# Patient Record
Sex: Female | Born: 1955 | Race: White | Hispanic: Yes | Marital: Single | Smoking: Never smoker
Health system: Southern US, Community
[De-identification: ages and names within clinical notes are randomized; demographics above are authoritative.]

## PROBLEM LIST (undated history)

## (undated) HISTORY — PX: TUBAL LIGATION: SHX77

## (undated) HISTORY — PX: EXTERNAL EAR SURGERY: SHX627

---

## 2003-10-16 ENCOUNTER — Ambulatory Visit (HOSPITAL_COMMUNITY): Admission: RE | Admit: 2003-10-16 | Discharge: 2003-10-16 | Payer: Self-pay | Admitting: Internal Medicine

## 2003-10-20 ENCOUNTER — Ambulatory Visit (HOSPITAL_COMMUNITY): Admission: RE | Admit: 2003-10-20 | Discharge: 2003-10-20 | Payer: Self-pay | Admitting: Internal Medicine

## 2004-01-25 ENCOUNTER — Encounter: Admission: RE | Admit: 2004-01-25 | Discharge: 2004-01-25 | Payer: Self-pay | Admitting: Otolaryngology

## 2006-03-10 ENCOUNTER — Emergency Department (HOSPITAL_COMMUNITY): Admission: EM | Admit: 2006-03-10 | Discharge: 2006-03-10 | Payer: Self-pay | Admitting: Emergency Medicine

## 2014-05-05 ENCOUNTER — Ambulatory Visit
Admission: RE | Admit: 2014-05-05 | Discharge: 2014-05-05 | Disposition: A | Discharge status: Home / Self Care | Payer: No Typology Code available for payment source | Source: Ambulatory Visit | Attending: Infectious Disease | Admitting: Infectious Disease

## 2014-05-05 ENCOUNTER — Other Ambulatory Visit: Payer: Self-pay | Admitting: Infectious Disease

## 2014-05-05 DIAGNOSIS — R7611 Nonspecific reaction to tuberculin skin test without active tuberculosis: Secondary | ICD-10-CM

## 2014-11-11 ENCOUNTER — Ambulatory Visit: Payer: Self-pay | Admitting: Gynecology

## 2014-12-10 ENCOUNTER — Ambulatory Visit (INDEPENDENT_AMBULATORY_CARE_PROVIDER_SITE_OTHER): Payer: BC Managed Care – PPO | Admitting: Gynecology

## 2014-12-10 ENCOUNTER — Encounter: Payer: Self-pay | Admitting: Gynecology

## 2014-12-10 ENCOUNTER — Other Ambulatory Visit (HOSPITAL_COMMUNITY)
Admission: RE | Admit: 2014-12-10 | Discharge: 2014-12-10 | Disposition: A | Discharge status: Home / Self Care | Payer: BC Managed Care – PPO | Source: Ambulatory Visit | Attending: Gynecology | Admitting: Gynecology

## 2014-12-10 VITALS — BP 128/86 | Ht 59.75 in | Wt 135.0 lb

## 2014-12-10 DIAGNOSIS — Z1151 Encounter for screening for human papillomavirus (HPV): Secondary | ICD-10-CM | POA: Insufficient documentation

## 2014-12-10 DIAGNOSIS — R52 Pain, unspecified: Secondary | ICD-10-CM

## 2014-12-10 DIAGNOSIS — Z1159 Encounter for screening for other viral diseases: Secondary | ICD-10-CM | POA: Diagnosis not present

## 2014-12-10 DIAGNOSIS — Z780 Asymptomatic menopausal state: Secondary | ICD-10-CM | POA: Diagnosis not present

## 2014-12-10 DIAGNOSIS — Z01419 Encounter for gynecological examination (general) (routine) without abnormal findings: Secondary | ICD-10-CM

## 2014-12-10 NOTE — Progress Notes (Signed)
Katelyn Phillips [birth year] [id number]   History:    59 y.o.  for annual gyn exam who has not had a medical examination and close to 10 years. Patient is from [place]. She reports no past history of any abnormal Pap smears. She has not had a colonoscopy. Her last mammogram was in 2005. She states she received her flu vaccine at work. She's had 3 children all delivered vaginally. She stated she recently menopause in her 71s has never been on hormone replacement therapy. Has no vasomotor symptoms. Only complaint is been of tiredness and fatigue. She works as a Secretary/administrator.  Past medical history,surgical history, family history and social history were all reviewed and documented in the EPIC chart.  Gynecologic History No LMP recorded. Patient is postmenopausal. Contraception: post menopausal status Last Pap: Over 10 years ago. Results were: normal Last mammogram: 2005. Results were: normal  Obstetric History OB History  Gravida Para Term Preterm AB SAB TAB Ectopic Multiple Living  3 3        3     # Outcome Date GA Lbr Len/2nd Weight Sex Delivery Anes PTL Lv  3 Para      Vag-Spont     2 Para      Vag-Spont     1 Para      Vag-Spont          ROS: A ROS was performed and pertinent positives and negatives are included in the history.  GENERAL: No fevers or chills. HEENT: No change in vision, no earache, sore throat or sinus congestion. NECK: No pain or stiffness. CARDIOVASCULAR: No chest pain or pressure. No palpitations. PULMONARY: No shortness of breath, cough or wheeze. GASTROINTESTINAL: No abdominal pain, nausea, vomiting or diarrhea, melena or bright red blood per rectum. GENITOURINARY: No urinary frequency, urgency, hesitancy or dysuria. MUSCULOSKELETAL: No joint or muscle pain, no back pain, no recent trauma. DERMATOLOGIC: No rash, no itching, no lesions. ENDOCRINE: No polyuria, polydipsia, no heat or cold intolerance. No recent change in weight. HEMATOLOGICAL: No  anemia or easy bruising or bleeding. NEUROLOGIC: No headache, seizures, numbness, tingling or weakness. PSYCHIATRIC: No depression, no loss of interest in normal activity or change in sleep pattern.     Exam: chaperone present  BP 128/86 mmHg  Ht 4' 11.75" (1.518 m)  Wt 135 lb (61.236 kg)  BMI 26.57 kg/m2  Body mass index is 26.57 kg/(m^2).  General appearance : Well developed well nourished female. No acute distress HEENT: Eyes: no retinal hemorrhage or exudates,  Neck supple, trachea midline, no carotid bruits, no thyroidmegaly Lungs: Clear to auscultation, no rhonchi or wheezes, or rib retractions  Heart: Regular rate and rhythm, no murmurs or gallops Breast:Examined in sitting and supine position were symmetrical in appearance, no palpable masses or tenderness,  no skin retraction, no nipple inversion, no nipple discharge, no skin discoloration, no axillary or supraclavicular lymphadenopathy Abdomen: no palpable masses or tenderness, no rebound or guarding Extremities: no edema or skin discoloration or tenderness  Pelvic:  Bartholin, Urethra, Skene Glands: Within normal limits             Vagina: No gross lesions or discharge, atrophic changes friable contact  Cervix: No gross lesions or discharge  Uterus  anteverted, normal size, shape and consistency, non-tender and mobile  Adnexa  Without masses or tenderness  Anus and perineum  normal   Rectovaginal  normal sphincter tone without palpated masses or tenderness  Hemoccult will schedule colonoscopy     Assessment/Plan:  59 y.o. female for annual exam with no prior medical exam in over 10 years. Pap smear with HPV screening will be done today. Patient will return back to the office in a fasting state later in the week for the following screening blood work: Comprehensive metabolic panel, fasting lipid profile, TSH, CBC, and urinalysis. I have spoken to patient's daughter and gave her the name of the gas urologist for  her to schedule a screening colonoscopy. I've also provided her with a requisition to schedule mammogram. Next year we'll need to do a bone density study. Of note because of tiredness and fatigue we went ahead and checked her also for vitamin D level.  New CDC guidelines is recommending patients be tested once in her lifetime for hepatitis C antibody who were born between [birth year] through 1965. This was discussed with the patient today and has agreed to be tested today.   Terrance Mass MD, 10:16 AM 12/10/2014

## 2014-12-10 NOTE — Patient Instructions (Signed)
Colonoscopa (Colonoscopy) Ardelia Mems colonoscopa es un examen que se realiza para examinar todo el intestino grueso (colon). Este examen puede ayudar a Hydrographic surveyor problemas, como tumores, plipos, inflamacin y reas de Social research officer, government. El examen dura aproximadamente 1hora.  INFORME A SU MDICO:   Cualquier alergia que tenga.  Todos los [employer], incluidos vitaminas, hierbas, gotas oftlmicas, cremas y medicamentos de venta libre.  Problemas previos que usted o los [employer] de su familia hayan tenido con el uso de anestsicos.  Enfermedades de [employer].  Cirugas previas.  Enfermedades patolgicas. RIESGOS Y COMPLICACIONES  En general, se trata de un procedimiento seguro. Sin embargo, Games developer procedimiento, pueden surgir complicaciones. Las complicaciones posibles son:  Hemorragias.  Desgarro o ruptura de la pared del colon.  Reaccin a los Stage manager.  Infeccin (raro). ANTES DEL PROCEDIMIENTO   Consulte a su mdico si debe cambiar o suspender los medicamentos que toma habitualmente.  Posiblemente se le recete una preparacin del colon por va oral. Esto incluye beber una gran cantidad de lquido medicinal desde el da anterior a su procedimiento. El lquido har que elimine muchas heces blandas hasta que sean casi claras o de color verdoso claro. De esta manera limpiar el colon para prepararlo para el procedimiento.  No coma ni beba nada ms una vez que haya comenzado con la preparacin del colon, a menos que el mdico le indique que es seguro [place].  Pdale a alguna persona que la lleve a su casa luego del procedimiento. PROCEDIMIENTO   Le administrarn un medicamento para que pueda relajarse (sedante).  Se recostar de costado con las rodillas flexionadas.  Se insertar un tubo largo y flexible con Ardelia Mems luz y Ardelia Mems cmara en el extremo (colonoscopio) a travs del recto y dentro del colon. La cmara enviar el video hacia  una pantalla de computadora a medida que se vaya moviendo por el colon. El colonoscopio tambin libera dixido de carbono para inflar el colon. Esto ayuda a que el mdico pueda ver mejor el rea.  Durante el examen, es posible que su mdico tome una pequea muestra de tejido (biopsia) para examinarla bajo el microscopio, si se encuentran anormalidades.  El examen finaliza cuando se ha examinado todo el colon. DESPUS DEL PROCEDIMIENTO   No conduzca vehculos durante las 24horas posteriores al examen.  Es posible que encuentre una pequea cantidad de sangre en la materia fecal.  Ileene Hutchinson tenga cantidades moderadas de gases y calambres o hinchazn abdominales leves. Esto se produce a causa del gas utilizado para inflar el colon durante el examen.  Pregunte cundo estarn [employer] del examen y cmo los obtendr. Asegrese de [place].   Esta informacin no tiene Marine scientist el consejo del mdico. Asegrese de hacerle al mdico cualquier pregunta que tenga.   Document Released: 11/09/2004 Document Revised: 11/20/2012 Elsevier Interactive Patient Education [employer].

## 2014-12-14 LAB — CYTOLOGY - PAP

## 2014-12-16 ENCOUNTER — Other Ambulatory Visit: Payer: BC Managed Care – PPO

## 2014-12-16 DIAGNOSIS — Z780 Asymptomatic menopausal state: Secondary | ICD-10-CM

## 2014-12-16 DIAGNOSIS — Z1159 Encounter for screening for other viral diseases: Secondary | ICD-10-CM

## 2014-12-16 DIAGNOSIS — R52 Pain, unspecified: Secondary | ICD-10-CM

## 2014-12-16 DIAGNOSIS — Z01419 Encounter for gynecological examination (general) (routine) without abnormal findings: Secondary | ICD-10-CM

## 2014-12-16 LAB — COMPREHENSIVE METABOLIC PANEL
ALBUMIN: 4.3 g/dL (ref 3.6–5.1)
ALT: 13 U/L (ref 6–29)
AST: 20 U/L (ref 10–35)
Alkaline Phosphatase: 80 U/L (ref 33–130)
BUN: 16 mg/dL (ref 7–25)
CO2: 27 mmol/L (ref 20–31)
Calcium: 9.9 mg/dL (ref 8.6–10.4)
Chloride: 104 mmol/L (ref 98–110)
Creat: 0.67 mg/dL (ref 0.50–1.05)
Glucose, Bld: 85 mg/dL (ref 65–99)
Potassium: 4 mmol/L (ref 3.5–5.3)
Sodium: 139 mmol/L (ref 135–146)
Total Bilirubin: 0.6 mg/dL (ref 0.2–1.2)
Total Protein: 7.8 g/dL (ref 6.1–8.1)

## 2014-12-16 LAB — LIPID PANEL
Cholesterol: 208 mg/dL — ABNORMAL HIGH (ref 125–200)
HDL: 93 mg/dL (ref >=46)
LDL Cholesterol: 101 mg/dL (ref <130)
TRIGLYCERIDES: 69 mg/dL (ref <150)
Total CHOL/HDL Ratio: 2.2 Ratio (ref <=5.0)
VLDL: 14 mg/dL (ref <30)

## 2014-12-16 LAB — TSH: TSH: 1.57 u[IU]/mL (ref 0.350–4.500)

## 2014-12-17 LAB — URINALYSIS W MICROSCOPIC + REFLEX CULTURE
Bacteria, UA: NONE SEEN [HPF]
Bilirubin Urine: NEGATIVE
Casts: NONE SEEN [LPF]
Crystals: NONE SEEN [HPF]
GLUCOSE, UA: NEGATIVE
Hgb urine dipstick: NEGATIVE
Ketones, ur: NEGATIVE
LEUKOCYTES UA: NEGATIVE
Nitrite: NEGATIVE
PH: 5.5 (ref 5.0–8.0)
Protein, ur: NEGATIVE
RBC / HPF: NONE SEEN RBC/HPF (ref <=2)
SPECIFIC GRAVITY, URINE: 1.016 (ref 1.001–1.035)
Squamous Epithelial / HPF: NONE SEEN [HPF] (ref <=5)
WBC, UA: NONE SEEN WBC/HPF (ref <=5)
Yeast: NONE SEEN [HPF]

## 2014-12-17 LAB — VITAMIN D 25 HYDROXY (VIT D DEFICIENCY, FRACTURES): Vit D, 25-Hydroxy: 52 ng/mL (ref 30–100)

## 2014-12-17 LAB — HEPATITIS C ANTIBODY: HCV Ab: NEGATIVE

## 2015-09-03 ENCOUNTER — Encounter: Payer: Self-pay | Admitting: Gastroenterology

## 2015-09-03 ENCOUNTER — Other Ambulatory Visit: Payer: Self-pay | Admitting: Gynecology

## 2015-09-03 DIAGNOSIS — Z1231 Encounter for screening mammogram for malignant neoplasm of breast: Secondary | ICD-10-CM

## 2015-09-24 ENCOUNTER — Ambulatory Visit
Admission: RE | Admit: 2015-09-24 | Discharge: 2015-09-24 | Disposition: A | Discharge status: Home / Self Care | Payer: BC Managed Care – PPO | Source: Ambulatory Visit | Attending: Gynecology | Admitting: Gynecology

## 2015-09-24 DIAGNOSIS — Z1231 Encounter for screening mammogram for malignant neoplasm of breast: Secondary | ICD-10-CM

## 2015-09-28 ENCOUNTER — Other Ambulatory Visit: Payer: Self-pay | Admitting: Gynecology

## 2015-09-28 DIAGNOSIS — R928 Other abnormal and inconclusive findings on diagnostic imaging of breast: Secondary | ICD-10-CM

## 2015-10-01 ENCOUNTER — Ambulatory Visit
Admission: RE | Admit: 2015-10-01 | Discharge: 2015-10-01 | Disposition: A | Discharge status: Home / Self Care | Payer: BC Managed Care – PPO | Source: Ambulatory Visit | Attending: Gynecology | Admitting: Gynecology

## 2015-10-01 DIAGNOSIS — R928 Other abnormal and inconclusive findings on diagnostic imaging of breast: Secondary | ICD-10-CM

## 2015-10-13 ENCOUNTER — Telehealth: Payer: Self-pay | Admitting: Behavioral Health

## 2015-10-13 NOTE — Telephone Encounter (Signed)
Unable to reach patient at time of Pre-Visit Call.  Left message for patient to return call when available.    

## 2015-10-14 ENCOUNTER — Ambulatory Visit (HOSPITAL_BASED_OUTPATIENT_CLINIC_OR_DEPARTMENT_OTHER)
Admission: RE | Admit: 2015-10-14 | Discharge: 2015-10-14 | Disposition: A | Discharge status: Home / Self Care | Payer: BC Managed Care – PPO | Source: Ambulatory Visit | Attending: Family Medicine | Admitting: Family Medicine

## 2015-10-14 ENCOUNTER — Ambulatory Visit (INDEPENDENT_AMBULATORY_CARE_PROVIDER_SITE_OTHER): Payer: BC Managed Care – PPO | Admitting: Family Medicine

## 2015-10-14 ENCOUNTER — Encounter: Payer: Self-pay | Admitting: Family Medicine

## 2015-10-14 VITALS — BP 108/70 | HR 58 | Temp 98.6°F | Ht 60.0 in | Wt 139.8 lb

## 2015-10-14 DIAGNOSIS — Z131 Encounter for screening for diabetes mellitus: Secondary | ICD-10-CM | POA: Diagnosis not present

## 2015-10-14 DIAGNOSIS — M79642 Pain in left hand: Secondary | ICD-10-CM | POA: Diagnosis not present

## 2015-10-14 DIAGNOSIS — M19042 Primary osteoarthritis, left hand: Secondary | ICD-10-CM | POA: Insufficient documentation

## 2015-10-14 DIAGNOSIS — Z1329 Encounter for screening for other suspected endocrine disorder: Secondary | ICD-10-CM

## 2015-10-14 DIAGNOSIS — Z1211 Encounter for screening for malignant neoplasm of colon: Secondary | ICD-10-CM

## 2015-10-14 DIAGNOSIS — Z130 Encounter for screening for diseases of the blood and blood-forming organs and certain disorders involving the immune mechanism: Secondary | ICD-10-CM | POA: Diagnosis not present

## 2015-10-14 DIAGNOSIS — M19041 Primary osteoarthritis, right hand: Secondary | ICD-10-CM | POA: Insufficient documentation

## 2015-10-14 DIAGNOSIS — M79641 Pain in right hand: Secondary | ICD-10-CM | POA: Diagnosis present

## 2015-10-14 DIAGNOSIS — Z13220 Encounter for screening for lipoid disorders: Secondary | ICD-10-CM

## 2015-10-14 DIAGNOSIS — R208 Other disturbances of skin sensation: Secondary | ICD-10-CM

## 2015-10-14 DIAGNOSIS — R200 Anesthesia of skin: Secondary | ICD-10-CM

## 2015-10-14 DIAGNOSIS — Z23 Encounter for immunization: Secondary | ICD-10-CM

## 2015-10-14 DIAGNOSIS — Z119 Encounter for screening for infectious and parasitic diseases, unspecified: Secondary | ICD-10-CM

## 2015-10-14 DIAGNOSIS — R7303 Prediabetes: Secondary | ICD-10-CM | POA: Insufficient documentation

## 2015-10-14 LAB — COMPREHENSIVE METABOLIC PANEL
ALBUMIN: 4.4 g/dL (ref 3.5–5.2)
ALT: 14 U/L (ref 0–35)
AST: 20 U/L (ref 0–37)
Alkaline Phosphatase: 76 U/L (ref 39–117)
BUN: 13 mg/dL (ref 6–23)
CO2: 30 mEq/L (ref 19–32)
Calcium: 9.6 mg/dL (ref 8.4–10.5)
Chloride: 104 mEq/L (ref 96–112)
Creatinine, Ser: 0.65 mg/dL (ref 0.40–1.20)
GFR: 98.61 mL/min (ref >60.00)
Glucose, Bld: 90 mg/dL (ref 70–99)
Potassium: 3.9 mEq/L (ref 3.5–5.1)
Sodium: 138 mEq/L (ref 135–145)
Total Bilirubin: 0.6 mg/dL (ref 0.2–1.2)
Total Protein: 8.2 g/dL (ref 6.0–8.3)

## 2015-10-14 LAB — LIPID PANEL
CHOLESTEROL: 210 mg/dL — AB (ref 0–200)
HDL: 85.7 mg/dL (ref >39.00)
LDL Cholesterol: 114 mg/dL — ABNORMAL HIGH (ref 0–99)
NonHDL: 124.36
Total CHOL/HDL Ratio: 2
Triglycerides: 53 mg/dL (ref 0.0–149.0)
VLDL: 10.6 mg/dL (ref 0.0–40.0)

## 2015-10-14 LAB — TSH: TSH: 0.96 u[IU]/mL (ref 0.35–4.50)

## 2015-10-14 LAB — CBC
HCT: 40.7 % (ref 36.0–46.0)
Hemoglobin: 13.6 g/dL (ref 12.0–15.0)
MCHC: 33.5 g/dL (ref 30.0–36.0)
MCV: 90.7 fl (ref 78.0–100.0)
Platelets: 271 10*3/uL (ref 150.0–400.0)
RBC: 4.49 Mil/uL (ref 3.87–5.11)
RDW: 14 % (ref 11.5–15.5)
WBC: 4.6 10*3/uL (ref 4.0–10.5)

## 2015-10-14 LAB — HEPATITIS C ANTIBODY: HCV AB: NEGATIVE

## 2015-10-14 LAB — VITAMIN B12: Vitamin B-12: 450 pg/mL (ref 211–911)

## 2015-10-14 LAB — HEMOGLOBIN A1C: Hgb A1c MFr Bld: 6.2 % (ref 4.6–6.5)

## 2015-10-14 LAB — FOLATE: Folate: >23.7 ng/mL (ref >5.9)

## 2015-10-14 NOTE — Progress Notes (Signed)
Pre visit review using our clinic review tool, if applicable. No additional management support is needed unless otherwise documented below in the visit note. 

## 2015-10-14 NOTE — Patient Instructions (Addendum)
It was very nice to see you today- I will be in touch with your labs asap After your blood draw please go to the ground floor to have your x-rays done. Then you can go home We will look for any cause of your hand pain in your blood and also on x-rays.  You can certainly try using OTC tylenol, and if that is not helpful than aleve or ibuprofen for pain in your hands. 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

## 2015-10-14 NOTE — Progress Notes (Signed)
[facility] [address] [place], [place] [address] [id number] [phone]  Date:  10/14/2015   Name:  Katelyn Phillips   DOB:  03-20-1955   MRN:  [id number]  PCP:  Lamar Blinks, MD    Chief Complaint: Establish Care (Pt here to est care. c/o occ aching in hands and numbness. will also have occ weakness when trying to lift things.  Occ joint pain that is starting to happen more often.  Would like flu vaccine today. )   History of Present Illness:  Katelyn Phillips is a 60 y.o. very pleasant female patient who presents with the following:  Here today as a new patient to establish care.  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 Otherwise she is generally in good heatlh.   She has noted numbness in both of her hands.  The right is worse.  Seems to get worse in the evenings/ nights, better in the morning. She notes a glove distribution of her numbness in both hands about up to the elbow.  A few years ago this was just in her fingers, but is now more into her arms.   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 She has not really tried any medications for her hand or leg pains, including anything OTC She is fasting today for labs  Would like a flu shot today  She has a mammogram scheduled for 2 weeks from now.  She sees Dr. Toney Rakes for her OBG care She would like a referral for a colonoscopy today  There are no active problems to display for this patient.   History reviewed. No pertinent past medical history.  Past Surgical History:  Procedure Laterality Date  . EXTERNAL EAR SURGERY    . TUBAL LIGATION      Social History  Substance Use Topics  . Smoking status: Never Smoker  . Smokeless tobacco: Not on file  . Alcohol use No    Family History  Problem Relation Age of  Onset  . Diabetes Sister     No Known Allergies  Medication list has been reviewed and updated.  No current outpatient prescriptions on file prior to visit.   No current facility-administered medications on file prior to visit.     Review of Systems: No fever, chills, nausea, vomiting, weight loss  As per HPI- otherwise negative.   Physical Examination: Vitals:   10/14/15 0957  BP: 108/70  Pulse: (!) 58  Temp: 98.6 F (37 C)   Vitals:   10/14/15 0957  Weight: 139 lb 12.8 oz (63.4 kg)  Height: 5' (1.524 m)   Body mass index is 27.3 kg/m. Ideal Body Weight: Weight in (lb) to have BMI = 25: 127.7  GEN: WDWN, NAD, Non-toxic, A & O x 3, mild overweight, looks well HEENT: Atraumatic, Normocephalic. Neck supple. No masses, No LAD.  Bilateral TM wnl, oropharynx normal.  PEERL,EOMI.   Ears and Nose: No external deformity. CV: RRR, No M/G/R. No JVD. No thrill. No extra heart sounds. PULM: CTA B, no wheezes, crackles, rhonchi. No retractions. No resp. distress. No accessory muscle use. ABD: S, NT, ND, +BS. No rebound. No HSM. EXTR: No c/c/e Spider veins on bilateral calves but do not see true varicose  veins Hands/ forearms, elbows show normal ROM. Normal strength and perfusion.  Slight hypertrophy of the IP joints of the fingers likely due to OA Negative testing for CTS including Tinel's and Phalen's today NEURO Normal gait.  PSYCH: Normally interactive. Conversant. Not depressed or anxious appearing.  Calm demeanor.    Assessment and Plan: Numbness in both hands - Plan: B12, Folate  Screening for deficiency anemia - Plan: CBC  Screening for thyroid disorder - Plan: TSH  Screening for lipid disorders - Plan: Lipid panel  Encounter for screening for infectious and parasitic diseases, unspecified - Plan: Hepatitis C antibody  Screening for diabetes mellitus - Plan: Comprehensive metabolic panel, Hemoglobin A1c  Pain in both hands - Plan: DG Hand Complete Right, DG  Hand Complete Left  It was very nice to see you today- I will be in touch with your labs asap After your blood draw please go to the ground floor to have your x-rays done. Then you can go home We will look for any cause of your hand pain in your blood and also on x-rays.  You can certainly try using OTC tylenol, and if that is not helpful than aleve or ibuprofen for pain in your hands. 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    Signed Lamar Blinks, MD  Results for orders placed or performed in visit on 10/14/15  CBC  Result Value Ref Range   WBC 4.6 4.0 - 10.5 K/uL   RBC 4.49 3.87 - 5.11 Mil/uL   Platelets 271.0 150.0 - 400.0 K/uL   Hemoglobin 13.6 12.0 - 15.0 g/dL   HCT 40.7 36.0 - 46.0 %   MCV 90.7 78.0 - 100.0 fl   MCHC 33.5 30.0 - 36.0 g/dL   RDW 14.0 11.5 - 15.5 %  Comprehensive metabolic panel  Result Value Ref Range   Sodium 138 135 - 145 mEq/L   Potassium 3.9 3.5 - 5.1 mEq/L   Chloride 104 96 - 112 mEq/L   CO2 30 19 - 32 mEq/L   Glucose, Bld 90 70 - 99 mg/dL   BUN 13 6 - 23 mg/dL   Creatinine, Ser 0.65 0.40 - 1.20 mg/dL   Total Bilirubin 0.6 0.2 - 1.2 mg/dL   Alkaline Phosphatase 76 39 - 117 U/L   AST 20 0 - 37 U/L   ALT 14 0 - 35 U/L   Total Protein 8.2 6.0 - 8.3 g/dL   Albumin 4.4 3.5 - 5.2 g/dL   Calcium 9.6 8.4 - 10.5 mg/dL   GFR 98.61 >60.00 mL/min  Lipid panel  Result Value Ref Range   Cholesterol 210 (H) 0 - 200 mg/dL   Triglycerides 53.0 0.0 - 149.0 mg/dL   HDL 85.70 >39.00 mg/dL   VLDL 10.6 0.0 - 40.0 mg/dL   LDL Cholesterol 114 (H) 0 - 99 mg/dL   Total CHOL/HDL Ratio 2    NonHDL 124.36   TSH  Result Value Ref Range   TSH 0.96 0.35 - 4.50 uIU/mL  Hepatitis C antibody  Result Value Ref Range   HCV Ab NEGATIVE NEGATIVE  Hemoglobin A1c  Result Value Ref Range    Hgb A1c MFr Bld 6.2 4.6 - 6.5 %  B12  Result Value Ref Range   Vitamin B-12 450 211 -  911 pg/mL  Folate  Result Value Ref Range   Folate >23.7 >5.9 ng/mL   Added pre-diabetes to problem list

## 2015-11-01 ENCOUNTER — Ambulatory Visit (AMBULATORY_SURGERY_CENTER): Payer: Self-pay

## 2015-11-01 ENCOUNTER — Encounter: Payer: Self-pay | Admitting: Gastroenterology

## 2015-11-01 VITALS — Ht 60.0 in | Wt 144.0 lb

## 2015-11-01 DIAGNOSIS — Z1211 Encounter for screening for malignant neoplasm of colon: Secondary | ICD-10-CM

## 2015-11-01 MED ORDER — SUPREP BOWEL PREP KIT 17.5-3.13-1.6 GM/177ML PO SOLN: 1.0000 | Freq: Once | ORAL | 0 refills | Status: AC

## 2015-11-01 NOTE — Progress Notes (Signed)
No allergies to eggs or soy No past problems with anesthesia No diet meds No home oxygen  No internet 

## 2015-11-15 ENCOUNTER — Encounter: Payer: Self-pay | Admitting: Gastroenterology

## 2015-11-15 ENCOUNTER — Telehealth: Payer: Self-pay | Admitting: Gastroenterology

## 2015-11-15 ENCOUNTER — Ambulatory Visit (AMBULATORY_SURGERY_CENTER): Payer: BC Managed Care – PPO | Admitting: Gastroenterology

## 2015-11-15 VITALS — BP 100/57 | HR 62 | Temp 97.5°F | Resp 18

## 2015-11-15 DIAGNOSIS — D127 Benign neoplasm of rectosigmoid junction: Secondary | ICD-10-CM

## 2015-11-15 DIAGNOSIS — Z1211 Encounter for screening for malignant neoplasm of colon: Secondary | ICD-10-CM

## 2015-11-15 MED ORDER — SODIUM CHLORIDE 0.9 % IV SOLN: 500.0000 mL | INTRAVENOUS | Status: DC

## 2015-11-15 NOTE — Op Note (Signed)
[place] Patient Name: Katelyn Phillips Procedure Date: 11/15/2015 10:42 AM MRN: [id number] Endoscopist: Remo Lipps P. Havery Moros , MD Age: 60 Referring MD:  Date of Birth: 05-13-1955 Gender: Female Account #: [id number] Procedure:                Colonoscopy Indications:              Screening for malignant neoplasm in the colon, This                            is the patient's first colonoscopy Medicines:                Monitored Anesthesia Care Procedure:                Pre-Anesthesia Assessment:                           - Prior to the procedure, a History and Physical                            was performed, and patient medications and                            allergies were reviewed. The patient's tolerance of                            previous anesthesia was also reviewed. The risks                            and benefits of the procedure and the sedation                            options and risks were discussed with the patient.                            All questions were answered, and informed consent                            was obtained. Prior Anticoagulants: The patient has                            taken no previous anticoagulant or antiplatelet                            agents. ASA Grade Assessment: I - A normal, healthy                            patient. After reviewing the risks and benefits,                            the patient was deemed in satisfactory condition to                            undergo the procedure.  After obtaining informed consent, the colonoscope                            was passed under direct vision. Throughout the                            procedure, the patient's blood pressure, pulse, and                            oxygen saturations were monitored continuously. The                            Model CF-HQ190L [id number]) scope was introduced                            through the anus and  advanced to the the cecum,                            identified by appendiceal orifice and ileocecal                            valve. The colonoscopy was performed without                            difficulty. The patient tolerated the procedure                            well. The quality of the bowel preparation was                            good. The ileocecal valve, appendiceal orifice, and                            rectum were photographed. Scope In: 10:58:47 AM Scope Out: 11:13:27 AM Scope Withdrawal Time: 0 hours 11 minutes 21 seconds  Total Procedure Duration: 0 hours 14 minutes 40 seconds  Findings:                 The perianal and digital rectal examinations were                            normal.                           A 4 mm polyp was found in the recto-sigmoid colon.                            The polyp was sessile. The polyp was removed with a                            cold snare. Resection and retrieval were complete.                           The exam was otherwise without abnormality on  direct and retroflexion views. Complications:            No immediate complications. Estimated blood loss:                            Minimal. Estimated Blood Loss:     Estimated blood loss was minimal. Impression:               - One 4 mm polyp at the recto-sigmoid colon,                            removed with a cold snare. Resected and retrieved.                           - The examination was otherwise normal on direct                            and retroflexion views. Recommendation:           - Patient has a contact number available for                            emergencies. The signs and symptoms of potential                            delayed complications were discussed with the                            patient. Return to normal activities tomorrow.                            Written discharge instructions were provided to the                             patient.                           - Resume previous diet.                           - Continue present medications.                           - No ibuprofen, naproxen, or other non-steroidal                            anti-inflammatory drugs for 2 weeks after polyp                            removal.                           - Await pathology results.                           - Repeat colonoscopy is recommended for  surveillance. The colonoscopy date will be                            determined after pathology results from today's                            exam become available for review. Remo Lipps P. Armbruster, MD 11/15/2015 11:16:09 AM This report has been signed electronically.

## 2015-11-15 NOTE — Progress Notes (Signed)
Called to room to assist during endoscopic procedure.  Patient ID and intended procedure confirmed with present staff. Received instructions for my participation in the procedure from the performing physician.  

## 2015-11-15 NOTE — Telephone Encounter (Signed)
Female answered phone when returning call.  He stated that she has GERD and often takes Adult nurse.  Advised MD will not be able to add-on EGD today due to time constraints but she may discuss further with him at procedure visit this am.                                                                     Angela/PV

## 2015-11-15 NOTE — Progress Notes (Signed)
interpreter and family member that speaks english at patient's bedside during recovery. During discharge explained for patient not to take non-steriodal anti-inflammaotry medications for 2 weeks including Aleve, Ibuprofen. Tylenol okay to use. They verbalizes understanding.

## 2015-11-15 NOTE — Progress Notes (Signed)
A/ox3, pleased with MAC, report to RN 

## 2015-11-15 NOTE — Patient Instructions (Addendum)
Colon polyp x 1 removed today. Result letter in your mail in 2-3 weeks.  Resume current medications. DO NOT take ibuprofen, naproxen, or other anti-inflammatory medications for 2 weeks.  Start Omeprazole 20 mg over the counter daily. Keep appointment to see Dr.Armbruster for reflux.   YOU HAD AN ENDOSCOPIC PROCEDURE TODAY AT [facility]:   Refer to the procedure report that was given to you for any specific questions about what was found during the examination.  If the procedure report does not answer your questions, please call your gastroenterologist to clarify.  If you requested that your care partner not be given the details of your procedure findings, then the procedure report has been included in a sealed envelope for you to review at your convenience later.  YOU SHOULD EXPECT: Some feelings of bloating in the abdomen. Passage of more gas than usual.  Walking can help get rid of the air that was put into your GI tract during the procedure and reduce the bloating. If you had a lower endoscopy (such as a colonoscopy or flexible sigmoidoscopy) you may notice spotting of blood in your stool or on the toilet paper. If you underwent a bowel prep for your procedure, you may not have a normal bowel movement for a few days.  Please Note:  You might notice some irritation and congestion in your nose or some drainage.  This is from the oxygen used during your procedure.  There is no need for concern and it should clear up in a day or so.  SYMPTOMS TO REPORT IMMEDIATELY:   Following lower endoscopy (colonoscopy or flexible sigmoidoscopy):  Excessive amounts of blood in the stool  Significant tenderness or worsening of abdominal pains  Swelling of the abdomen that is new, acute  Fever of 100F or higher   For urgent or emergent issues, a gastroenterologist can be reached at any hour by calling [phone].   DIET:  We do recommend a small meal at first, but then you may proceed  to your regular diet.  Drink plenty of fluids but you should avoid alcoholic beverages for 24 hours.  ACTIVITY:  You should plan to take it easy for the rest of today and you should NOT DRIVE or use heavy machinery until tomorrow (because of the sedation medicines used during the test).    FOLLOW UP: Our staff will call the number listed on your records the next business day following your procedure to check on you and address any questions or concerns that you may have regarding the information given to you following your procedure. If we do not reach you, we will leave a message.  However, if you are feeling well and you are not experiencing any problems, there is no need to return our call.  We will assume that you have returned to your regular daily activities without incident.  If any biopsies were taken you will be contacted by phone or by letter within the next 1-3 weeks.  Please call us at [phone] if you have not heard about the biopsies in 3 weeks.    SIGNATURES/CONFIDENTIALITY: You and/or your care partner have signed paperwork which will be entered into your electronic medical record.  These signatures attest to the fact that that the information above on your After Visit Summary has been reviewed and is understood.  Full responsibility of the confidentiality of this discharge information lies with you and/or your care-partner.   Plipos en el colon  (Colon  Polyps) Los plipos son masas de tejido que crecen dentro del cuerpo. Los plipos pueden desarrollarse en el intestino grueso (colon). La mayora de los plipos son no cancerosos (benignos). Sin embargo, algunos plipos pueden convertirse en cancerosos con el tiempo. Los plipos que sean ms grandes que un guisante pueden ser [employer]. Para estar seguros, los mdicos extirpan y [employer] plipos.  CAUSAS  Se forman cuando ciertas mutaciones genticas hacen que las clulas se desarrollen y se dividan por dems.   FACTORES DE RIESGO  Hay un nmero de factores de riesgo que pueden aumentar las probabilidades de padecer plipos en el colon. Ellos son:   Ser mayor de 35 aos de edad.  Historia familiar de cncer o plipos de colon.  Ciertas enfermedades crnicas como la colitis o la enfermedad de Crohn.  Tener sobrepeso.  El hbito de fumar.  El sedentarismo.  Beber alcohol en exceso. SNTOMAS  La mayor parte de los plipos no causa sntomas. Si se presentan sntomas, stos pueden ser:   [employer] materia fecal. Heces de color rojo oscuro o negro.  Constipacin o diarrea que duran ms de 1 semana. DIAGNSTICO  [place] persona con frecuencia no sabe que tiene plipos [employer] su mdico los halla durante un examen fsico de [place]. El mdico puede usar 4 tipos de pruebas para Hydrographic surveyor plipos:   Examen Musician. Se colocar guantes y palpar el interior del recto. Esta prueba detectar solo plipos en el recto.  Enema de bario. El mdico introduce un lquido llamado bario en el recto y luego toma radiografas del colon. El bario hace que el colon se vea blanco. Los plipos son de color oscuro, por lo que son fciles de [name].  Sigmoideoscopia. Se coloca un tubo delgado y flexible (sigmoideoscopio) en el recto. Este sigmoideoscopio tiene [place] fuente de luz y Ardelia Mems pequea cmara de video. El mdico [place] el sigmoideoscopio para observar el ltimo tercio del colon.  Colonoscopa. Esta prueba es similar a la sigmoideoscopia, pero el mdico examina todo el colon. Este es el mtodo ms frecuente para Hydrographic surveyor y extirpar los plipos. TRATAMIENTO  Todo plipo ser extirpado Daneil Dan sigmoideoscopa o una colonoscopa. Luego se analizan para Heritage manager.  PREVENCIN  Para disminuir los riesgos de volver a Best boy plipos en el colon:   Coma mucha fruta y [place]. Evite las comidas grasas.  No fume.  Evite consumir alcohol.  [place].  Baje  de peso segn las indicaciones de su mdico.  Consuma mucho clcio y folato. Las comidas que contienen calcio son la Athena, los quesos y el brcoli. Las comidas que contienen folato son los garbanzos, los frijoles rojos y Nurse, mental health. INSTRUCCIONES PARA EL CUIDADO EN EL HOGAR  Cumpla con todas las visitas de control, segn le indique su mdico. Podr necesitar exmenes peridicos para controlar si vuelven a aparecer.  SOLICITE ATENCIN MDICA SI:  Nota sangrado al mover el intestino.    Esta informacin no tiene Marine scientist el consejo del mdico. Asegrese de hacerle al mdico cualquier pregunta que tenga.   Document Released: 08/01/2011 Elsevier Interactive Patient Education [employer].

## 2015-11-16 ENCOUNTER — Telehealth: Payer: Self-pay

## 2015-11-16 ENCOUNTER — Telehealth: Payer: Self-pay | Admitting: *Deleted

## 2015-11-16 NOTE — Telephone Encounter (Signed)
  Follow up Call-  Call back number 11/15/2015  Post procedure Call Back phone  # [phone]  Permission to leave phone message Yes  Some recent data might be hidden    No answering machine picked up, no message left

## 2015-11-16 NOTE — Telephone Encounter (Signed)
  Follow up Call-  Call back number 11/15/2015  Post procedure Call Back phone  # [phone]  Permission to leave phone message Yes  Some recent data might be hidden    Patient was called for follow up after her procedure on 11/15/2015. No answer at any of the numbers given for follow up phone call. A message was left on the answering machine. This was the third attempt to contact the patient.

## 2015-11-24 ENCOUNTER — Encounter: Payer: Self-pay | Admitting: Gastroenterology

## 2015-12-02 ENCOUNTER — Ambulatory Visit (INDEPENDENT_AMBULATORY_CARE_PROVIDER_SITE_OTHER): Payer: BC Managed Care – PPO | Admitting: Family Medicine

## 2015-12-02 ENCOUNTER — Encounter: Payer: Self-pay | Admitting: Family Medicine

## 2015-12-02 VITALS — BP 119/64 | HR 58 | Temp 97.7°F | Ht 60.0 in | Wt 138.6 lb

## 2015-12-02 DIAGNOSIS — M25562 Pain in left knee: Secondary | ICD-10-CM

## 2015-12-02 MED ORDER — MELOXICAM 7.5 MG PO TABS: ORAL_TABLET | ORAL | 0 refills | Status: DC

## 2015-12-02 NOTE — Progress Notes (Signed)
Pre visit review using our clinic review tool, if applicable. No additional management support is needed unless otherwise documented below in the visit note. 

## 2015-12-02 NOTE — Progress Notes (Signed)
[facility] [address], [place], [place] [address] [id number] [phone]  Date:  12/02/2015   Name:  Katelyn Phillips   DOB:  1955/04/23   MRN:  [id number]  PCP:  Lamar Blinks, MD    Chief Complaint: Knee Pain (c/o knee pain, more in the left than the right x 3 weeks ago . Swelling on left knee. )   History of Present Illness:  Katelyn Phillips is a 60 y.o. very pleasant female patient who presents with the following:  History of pre-diabetes, OA of her hands. Labs at her last visit looked good.  Here today with complaint of pain in her left knee for about 3 weeks. Mostly her left knee hurts, but her right knee hurts a little as well.  Never had this in the past.  No particular injury; insidious onset of pain. One night when he bent her knee she felt like a scraping or grating sensation in the knee.   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   The knee may feel like it gets stuck, and will click/ pop.   She has noted some feeling of instability. No falls however She is not wearing any sort of brace currently  No fever, chills, nausea, vomiting, CP, SOB.  Her other joints are ok except for her hands which do bother her at times.   She has not tried any medication for her knee,   Patient Active Problem List   Diagnosis Date Noted  . Pre-diabetes 10/14/2015  . Osteoarthritis of both hands 10/14/2015    No past medical history on file.  Past Surgical History:  Procedure Laterality Date  . EXTERNAL EAR SURGERY    . TUBAL LIGATION      Social History  Substance Use Topics  . Smoking status: Never Smoker  . Smokeless tobacco: Never Used  . Alcohol use No    Family History  Problem Relation Age of Onset  . Diabetes Sister   . Colon cancer Neg Hx     No Known Allergies  Medication list has been reviewed and updated.  Current Outpatient Prescriptions on File Prior to Visit   Medication Sig Dispense Refill  . MULTIPLE VITAMIN PO Take 1 tablet by mouth daily.     Current Facility-Administered Medications on File Prior to Visit  Medication Dose Route Frequency Provider Last Rate Last Dose  . 0.9 %  sodium chloride infusion  500 mL Intravenous Continuous Manus Gunning, MD        Review of Systems:  As per HPI- otherwise negative.   Physical Examination: Vitals:   12/02/15 0929  BP: 119/64  Pulse: (!) 58  Temp: 97.7 F (36.5 C)   Vitals:   12/02/15 0929  Weight: 138 lb 9.6 oz (62.9 kg)  Height: 5' (1.524 m)   Body mass index is 27.07 kg/m. Ideal Body Weight: Weight in (lb) to have BMI = 25: 127.7  GEN: WDWN, NAD, Non-toxic, A & O x 3, overweight, looks well HEENT: Atraumatic, Normocephalic. Neck supple. No masses, No LAD.  Bilateral TM wnl, oropharynx normal.  PEERL,EOMI.   Ears and Nose: No external deformity. CV: RRR, No M/G/R. No JVD. No thrill. No extra heart sounds. PULM: CTA B, no wheezes, crackles, rhonchi. No retractions. No resp. distress. No accessory muscle use. EXTR: No c/c/e NEURO Normal gait.  PSYCH: Normally interactive. Conversant. Not depressed or anxious appearing.  Calm demeanor.  LEFT knee: no effusion, heat or swelling.  No skin lesion.  No redness. She does have crepitus with flexion and extension and notes medial > lateral joint line tenderness. Patellar DTR is intact.  Knee is stable to ligamentous testing   Assessment and Plan: Acute pain of left knee - Plan: meloxicam (MOBIC) 7.5 MG tablet  Here today with left knee pain- she does likely have OA on exam. Offered plain films today but she would like to try conservative therapy first.  Mobic, OTC knee brace, ice.  If not better in 2-3 weeks they will let me know- Sooner if worse.     Signed Lamar Blinks, MD

## 2015-12-02 NOTE — Patient Instructions (Signed)
It was good to see you today- use the mobic as needed for pain, 1-2 pills a day for the next 2-3 weeks. You might also try a knee brace and ice.  If your knee is not getting back to normal let me know and I will order an x-ray for you.

## 2015-12-13 ENCOUNTER — Encounter: Payer: BC Managed Care – PPO | Admitting: Gynecology

## 2015-12-15 ENCOUNTER — Encounter: Payer: BC Managed Care – PPO | Admitting: Gynecology

## 2015-12-16 ENCOUNTER — Encounter: Payer: BC Managed Care – PPO | Admitting: Family Medicine

## 2016-01-20 ENCOUNTER — Ambulatory Visit: Payer: BC Managed Care – PPO | Admitting: Gastroenterology

## 2016-04-04 ENCOUNTER — Other Ambulatory Visit: Payer: Self-pay | Admitting: Occupational Medicine

## 2016-04-04 ENCOUNTER — Ambulatory Visit: Payer: Self-pay

## 2016-04-04 DIAGNOSIS — M79671 Pain in right foot: Secondary | ICD-10-CM

## 2016-06-28 ENCOUNTER — Encounter: Payer: Self-pay | Admitting: Gynecology

## 2017-05-31 ENCOUNTER — Ambulatory Visit: Payer: Self-pay | Admitting: *Deleted

## 2017-05-31 NOTE — Telephone Encounter (Signed)
Patient is having headaches for last 3 weeks on and off. Patient states every time she bends over she gets dizzy. She has back ache and leg pain. Patient states she is not getting better- patient's daughter is calling and answering questions about her mother. Appointment has been scheduled for Monday per her request- strongly warned if her mother has any changes at all- she should take her to the ED- she understands.  Reason for Disposition . [1] MODERATE headache (e.g., interferes with normal activities) AND [2] present > 24 hours AND [3] unexplained  (Exceptions: analgesics not tried, typical migraine, or headache part of viral illness)  Answer Assessment - Initial Assessment Questions 1. LOCATION: "Where does it hurt?"      All over 2. ONSET: "When did the headache start?" (Minutes, hours or days)      3 weeks 3. PATTERN: "Does the pain come and go, or has it been constant since it started?"     Comes and goes 4. SEVERITY: "How bad is the pain?" and "What does it keep you from doing?"  (e.g., Scale 1-10; mild, moderate, or severe)   - MILD (1-3): doesn't interfere with normal activities    - MODERATE (4-7): interferes with normal activities or awakens from sleep    - SEVERE (8-10): excruciating pain, unable to do any normal activities        Patient had congestion and cough- she said that she thought her head was going to explode. Patient's head feels like she has an echo- she bends down and feels dizzy. 5. RECURRENT SYMPTOM: "Have you ever had headaches before?" If so, ask: "When was the last time?" and "What happened that time?"      Patient has had headaches before- but never headaches that do not go away. OTC- not helping 6. CAUSE: "What do you think is causing the headache?"     Congestion- but that is better now 7. MIGRAINE: "Have you been diagnosed with migraine headaches?" If so, ask: "Is this headache similar?"      no 8. HEAD INJURY: "Has there been any recent injury to the head?"       no 9. OTHER SYMPTOMS: "Do you have any other symptoms?" (fever, stiff neck, eye pain, sore throat, cold symptoms)     Cold symptoms- cough, eyes burning 10. PREGNANCY: "Is there any chance you are pregnant?" "When was your last menstrual period?"       n/a  Protocols used: HEADACHE-A-AH

## 2017-06-04 ENCOUNTER — Ambulatory Visit: Payer: BC Managed Care – PPO | Admitting: Medical

## 2017-06-04 ENCOUNTER — Encounter: Payer: Self-pay | Admitting: Medical

## 2017-06-04 VITALS — BP 110/55 | HR 62 | Temp 98.2°F | Resp 16 | Ht 60.5 in | Wt 138.4 lb

## 2017-06-04 DIAGNOSIS — Z124 Encounter for screening for malignant neoplasm of cervix: Secondary | ICD-10-CM | POA: Diagnosis not present

## 2017-06-04 DIAGNOSIS — R3981 Functional urinary incontinence: Secondary | ICD-10-CM | POA: Diagnosis not present

## 2017-06-04 DIAGNOSIS — S46819A Strain of other muscles, fascia and tendons at shoulder and upper arm level, unspecified arm, initial encounter: Secondary | ICD-10-CM

## 2017-06-04 DIAGNOSIS — M25562 Pain in left knee: Secondary | ICD-10-CM

## 2017-06-04 DIAGNOSIS — R5383 Other fatigue: Secondary | ICD-10-CM | POA: Diagnosis not present

## 2017-06-04 DIAGNOSIS — R51 Headache: Secondary | ICD-10-CM | POA: Diagnosis not present

## 2017-06-04 DIAGNOSIS — R42 Dizziness and giddiness: Secondary | ICD-10-CM

## 2017-06-04 DIAGNOSIS — H539 Unspecified visual disturbance: Secondary | ICD-10-CM

## 2017-06-04 DIAGNOSIS — R519 Headache, unspecified: Secondary | ICD-10-CM

## 2017-06-04 DIAGNOSIS — I959 Hypotension, unspecified: Secondary | ICD-10-CM | POA: Diagnosis not present

## 2017-06-04 DIAGNOSIS — R110 Nausea: Secondary | ICD-10-CM | POA: Diagnosis not present

## 2017-06-04 LAB — COMPREHENSIVE METABOLIC PANEL
ALBUMIN: 4.1 g/dL (ref 3.5–5.2)
ALT: 15 U/L (ref 0–35)
AST: 21 U/L (ref 0–37)
Alkaline Phosphatase: 77 U/L (ref 39–117)
BUN: 12 mg/dL (ref 6–23)
CALCIUM: 10.2 mg/dL (ref 8.4–10.5)
CHLORIDE: 104 meq/L (ref 96–112)
CO2: 25 mEq/L (ref 19–32)
Creatinine, Ser: 0.58 mg/dL (ref 0.40–1.20)
GFR: 111.86 mL/min (ref >60.00)
Glucose, Bld: 91 mg/dL (ref 70–99)
POTASSIUM: 4 meq/L (ref 3.5–5.1)
SODIUM: 140 meq/L (ref 135–145)
Total Bilirubin: 0.5 mg/dL (ref 0.2–1.2)
Total Protein: 7.7 g/dL (ref 6.0–8.3)

## 2017-06-04 LAB — CBC WITH DIFFERENTIAL/PLATELET
Basophils Absolute: 0 10*3/uL (ref 0.0–0.1)
Basophils Relative: 0.5 % (ref 0.0–3.0)
EOS ABS: 0.1 10*3/uL (ref 0.0–0.7)
Eosinophils Relative: 3.6 % (ref 0.0–5.0)
HCT: 38.8 % (ref 36.0–46.0)
HEMOGLOBIN: 13.1 g/dL (ref 12.0–15.0)
LYMPHS ABS: 2.1 10*3/uL (ref 0.7–4.0)
Lymphocytes Relative: 49.8 % — ABNORMAL HIGH (ref 12.0–46.0)
MCHC: 33.8 g/dL (ref 30.0–36.0)
MCV: 92.6 fl (ref 78.0–100.0)
MONO ABS: 0.3 10*3/uL (ref 0.1–1.0)
Monocytes Relative: 7.7 % (ref 3.0–12.0)
NEUTROS PCT: 38.4 % — AB (ref 43.0–77.0)
Neutro Abs: 1.6 10*3/uL (ref 1.4–7.7)
Platelets: 271 10*3/uL (ref 150.0–400.0)
RBC: 4.2 Mil/uL (ref 3.87–5.11)
RDW: 13.8 % (ref 11.5–15.5)
WBC: 4.2 10*3/uL (ref 4.0–10.5)

## 2017-06-04 LAB — VITAMIN B12: VITAMIN B 12: 326 pg/mL (ref 211–911)

## 2017-06-04 LAB — TSH: TSH: 1.54 u[IU]/mL (ref 0.35–4.50)

## 2017-06-04 LAB — VITAMIN D 25 HYDROXY (VIT D DEFICIENCY, FRACTURES): VITD: 41 ng/mL (ref 30.00–100.00)

## 2017-06-04 MED ORDER — MELOXICAM 7.5 MG PO TABS: ORAL_TABLET | ORAL | 0 refills | Status: DC

## 2017-06-04 MED ORDER — CYCLOBENZAPRINE HCL 5 MG PO TABS: 5.0000 mg | ORAL_TABLET | Freq: Every day | ORAL | 0 refills | Status: DC

## 2017-06-04 NOTE — Patient Instructions (Signed)
For your recent headache that has changed significantly compared to your prior headaches, I do want to see if we can get CT of your head done without contrast.  Staff will try to get that prior authorized and then contact you.  During the interim if your headache worsens with any signs or symptoms as we discussed today then recommend being seen in the emergency department for stat imaging.  Will prescribe meloxicam anti-inflammatory today and see if this helps a headache.  Also can take Flexeril low-dose tablet at night.  Muscle relaxant might help if you have tension component to your headache.  For history of blood pressure being on the low side and recent fatigue, I ordered CBC, CMP, B12, B1, TSH, and vitamin D.  For your concern about dropped bladder, need for screening Pap smear and urinary incontinence, I did go ahead and refer you to gynecologist.  If 1 week passes a note has called you then I recommend you call here and asked to speak to [place].  Follow-up in 2 weeks or as needed.

## 2017-06-04 NOTE — Progress Notes (Signed)
Subjective:    Patient ID: Katelyn Phillips, female    DOB: 1955/09/27, 62 y.o.   MRN: [id number]  HPI  Pt in with one month of HA. HA mostly all day. Level varies but will have for most of the day. Occasional nausea with ha. At times vision blurred. At times sound sensitive. Katelyn Phillips has been present for about a month. Daily ha with very brief relief of ha but then returns quickly. At times times nausea, dizzy and blurred vision  Pt in past had mild occasional ha in past. Would only take aspirin and would go away. Ha in past only twice a week and last 2 hours then would resolve.  Pt recently has tried tylenol and advil but has not helped.      Review of Systems  Constitutional: Negative for diaphoresis, fatigue and fever.  HENT: Negative for congestion, dental problem, drooling, ear pain, mouth sores, postnasal drip, rhinorrhea, sinus pressure, sinus pain and sneezing.   Respiratory: Negative for cough, choking, chest tightness, shortness of breath and wheezing.        Rare cough. But when coughs her ha is worse.  Cardiovascular: Negative for chest pain and palpitations.  Gastrointestinal: Negative for abdominal pain, blood in stool and constipation.  Genitourinary: Negative for decreased urine volume, dysuria, flank pain, frequency, pelvic pain, vaginal discharge and vaginal pain.       Pt feels like her bladder is dropping. Some occasional urinary incontinence.  Musculoskeletal: Negative for back pain, joint swelling, myalgias and neck stiffness.       Some trapezius pain over past.   Skin: Negative for rash.  Neurological: Positive for dizziness. Negative for seizures, syncope, speech difficulty, weakness, numbness and headaches.       Nausea at times but no vomiting.   Today 2/10.   Psychiatric/Behavioral: Negative for agitation, confusion, dysphoric mood, hallucinations, self-injury and suicidal ideas. The patient is not nervous/anxious.        Pt does not think she is stressed.  But is worried about her health.   No past medical history on file.   Social History   Socioeconomic History  . Marital status: Single    Spouse name: Not on file  . Number of children: Not on file  . Years of education: Not on file  . Highest education level: Not on file  Occupational History  . Not on file  Social Needs  . Financial resource strain: Not on file  . Food insecurity:    Worry: Not on file    Inability: Not on file  . Transportation needs:    Medical: Not on file    Non-medical: Not on file  Tobacco Use  . Smoking status: Never Smoker  . Smokeless tobacco: Never Used  Substance and Sexual Activity  . Alcohol use: No    Alcohol/week: 0.0 oz  . Drug use: No  . Sexual activity: Never  Lifestyle  . Physical activity:    Days per week: Not on file    Minutes per session: Not on file  . Stress: Not on file  Relationships  . Social connections:    Talks on phone: Not on file    Gets together: Not on file    Attends religious service: Not on file    Active member of club or organization: Not on file    Attends meetings of clubs or organizations: Not on file    Relationship status: Not on file  . Intimate partner violence:  Fear of current or ex partner: Not on file    Emotionally abused: Not on file    Physically abused: Not on file    Forced sexual activity: Not on file  Other Topics Concern  . Not on file  Social History Narrative  . Not on file    Past Surgical History:  Procedure Laterality Date  . EXTERNAL EAR SURGERY    . TUBAL LIGATION      Family History  Problem Relation Age of Onset  . Diabetes Sister   . Colon cancer Neg Hx     No Known Allergies  Current Outpatient Medications on File Prior to Visit  Medication Sig Dispense Refill  . meloxicam (MOBIC) 7.5 MG tablet Take 1 or 2 daily as needed for knee pain 60 tablet 0  . MULTIPLE VITAMIN PO Take 1 tablet by mouth daily.     Current Facility-Administered Medications on File  Prior to Visit  Medication Dose Route Frequency Provider Last Rate Last Dose  . 0.9 %  sodium chloride infusion  500 mL Intravenous Continuous Armbruster, Carlota Raspberry, MD        BP (!) 110/55 (BP Location: Left Arm, Patient Position: Sitting, Cuff Size: Small)   Pulse 62   Temp 98.2 F (36.8 C) (Oral)   Resp 16   Ht 5' 0.5" (1.537 m)   Wt 138 lb 6.4 oz (62.8 kg)   SpO2 100%   BMI 26.58 kg/m       Objective:   Physical Exam  General Mental Status- Alert. General Appearance- Not in acute distress.    Heent- negative.no sinus pressure.  Skin General: Color- Normal Color. Moisture- Normal Moisture.  Neck Carotid Arteries- Normal color. Moisture- Normal Moisture. No carotid bruits. No JVD.  Chest and Lung Exam Auscultation: Breath Sounds:-Normal.  Cardiovascular Auscultation:Rythm- Regular. Murmurs & Other Heart Sounds:Auscultation of the heart reveals- No Murmurs.  Abdomen Inspection:-Inspeection Normal. Palpation/Percussion:Note:No mass. Palpation and Percussion of the abdomen reveal- Non Tender, Non Distended + BS, no rebound or guarding.   Neurologic Cranial Nerve exam:- CN III-XII intact(No nystagmus), symmetric smile. Drift Test:- No drift. Romberg Exam:- Negative.  Heal to Toe Gait exam:-Normal. Finger to Nose:- Normal/Intact Strength:- 5/5 equal and symmetric strength both upper and lower extremities.      Assessment & Plan:  For your recent headache that has changed significantly compared to your prior headaches, I do want to see if we can get CT of your head done without contrast.  Staff will try to get that prior authorized and then contact you.  During the interim if your headache worsens with any signs or symptoms as we discussed today then recommend being seen in the emergency department for stat imaging.  Will prescribe meloxicam anti-inflammatory today and see if this helps a headache.  Also can take Flexeril low-dose tablet at night.  Muscle  relaxant might help if you have tension component to your headache.  For history of blood pressure being on the low side and recent fatigue, I ordered CBC, CMP, B12, B1, TSH, and vitamin D.  For your concern about dropped bladder, need for screening Pap smear and urinary incontinence, I did go ahead and refer you to gynecologist.  If 1 week passes a note has called you then I recommend you call here and asked to speak to [place].  Follow-up in 2 weeks or as needed.  Mackie Pai, PA-C

## 2017-06-06 ENCOUNTER — Ambulatory Visit: Payer: Self-pay | Admitting: Internal Medicine

## 2017-06-07 ENCOUNTER — Ambulatory Visit (HOSPITAL_BASED_OUTPATIENT_CLINIC_OR_DEPARTMENT_OTHER)
Admission: RE | Admit: 2017-06-07 | Discharge: 2017-06-07 | Disposition: A | Discharge status: Home / Self Care | Payer: BC Managed Care – PPO | Source: Ambulatory Visit | Attending: Medical | Admitting: Medical

## 2017-06-07 DIAGNOSIS — R42 Dizziness and giddiness: Secondary | ICD-10-CM | POA: Insufficient documentation

## 2017-06-07 DIAGNOSIS — R110 Nausea: Secondary | ICD-10-CM | POA: Diagnosis not present

## 2017-06-07 DIAGNOSIS — R51 Headache: Secondary | ICD-10-CM | POA: Diagnosis present

## 2017-06-07 DIAGNOSIS — H539 Unspecified visual disturbance: Secondary | ICD-10-CM | POA: Diagnosis not present

## 2017-06-07 DIAGNOSIS — R519 Headache, unspecified: Secondary | ICD-10-CM

## 2017-06-07 LAB — VITAMIN B1: VITAMIN B1 (THIAMINE): 9 nmol/L (ref 8–30)

## 2017-06-18 ENCOUNTER — Encounter: Payer: Self-pay | Admitting: Medical

## 2017-06-18 ENCOUNTER — Ambulatory Visit: Payer: BC Managed Care – PPO | Admitting: Medical

## 2017-06-18 VITALS — BP 122/66 | HR 65 | Temp 97.8°F | Resp 16 | Ht 60.5 in | Wt 143.2 lb

## 2017-06-18 DIAGNOSIS — J3489 Other specified disorders of nose and nasal sinuses: Secondary | ICD-10-CM | POA: Diagnosis not present

## 2017-06-18 DIAGNOSIS — R067 Sneezing: Secondary | ICD-10-CM

## 2017-06-18 DIAGNOSIS — G44209 Tension-type headache, unspecified, not intractable: Secondary | ICD-10-CM | POA: Diagnosis not present

## 2017-06-18 MED ORDER — KETOROLAC TROMETHAMINE 30 MG/ML IJ SOLN
30.0000 mg | Freq: Once | INTRAMUSCULAR | Status: AC
  Administered 2017-06-18: 30 mg via INTRAMUSCULAR

## 2017-06-18 MED ORDER — CYCLOBENZAPRINE HCL 5 MG PO TABS: 5.0000 mg | ORAL_TABLET | Freq: Every day | ORAL | 0 refills | Status: DC

## 2017-06-18 MED ORDER — FLUTICASONE PROPIONATE 50 MCG/ACT NA SUSP: 2.0000 | Freq: Every day | NASAL | 1 refills | Status: DC

## 2017-06-18 MED ORDER — MELOXICAM 7.5 MG PO TABS: ORAL_TABLET | ORAL | 0 refills | Status: DC

## 2017-06-18 NOTE — Progress Notes (Signed)
Subjective:    Patient ID: Katelyn Phillips, female    DOB: 1956/01/05, 62 y.o.   MRN: [id number]  HPI  Pt in for follow up.  Still having some HA. For example  yesterday  Ha for 3 hours .States some frontal ha, and back of neck as well. But was minimal level of ha. She states Saturday did not have HA. Nor did not HA on Friday. She states all last week for about 6 days did not have any ha.   Today she has mild ha 3/10 level but no gross motor or sensory function deficits.  Pt thinks that meloxicam and flexeril when she used she feels did stop her HA.  Pt ran out of medication. She states much improved after she used these meds.  Pt CT of  Head was negative.     Review of Systems  Constitutional: Negative for chills, fatigue and fever.  HENT: Positive for sneezing.        Some sneezing this weekend.  Eyes: Negative for pain and itching.  Respiratory: Negative for cough, chest tightness, shortness of breath and wheezing.   Cardiovascular: Negative for chest pain and palpitations.  Gastrointestinal: Negative for abdominal pain.  Musculoskeletal: Negative for back pain.  Skin: Negative for rash.  Neurological: Positive for headaches.       Now has very faint low level ha frontal area. But no associated signs or symptoms.  Hematological: Negative for adenopathy. Does not bruise/bleed easily.  Psychiatric/Behavioral: Negative for behavioral problems, confusion, dysphoric mood and hallucinations. The patient is not nervous/anxious.     No past medical history on file.   Social History   Socioeconomic History  . Marital status: Single    Spouse name: Not on file  . Number of children: Not on file  . Years of education: Not on file  . Highest education level: Not on file  Occupational History  . Not on file  Social Needs  . Financial resource strain: Not on file  . Food insecurity:    Worry: Not on file    Inability: Not on file  . Transportation needs:    Medical: Not  on file    Non-medical: Not on file  Tobacco Use  . Smoking status: Never Smoker  . Smokeless tobacco: Never Used  Substance and Sexual Activity  . Alcohol use: No    Alcohol/week: 0.0 oz  . Drug use: No  . Sexual activity: Never  Lifestyle  . Physical activity:    Days per week: Not on file    Minutes per session: Not on file  . Stress: Not on file  Relationships  . Social connections:    Talks on phone: Not on file    Gets together: Not on file    Attends religious service: Not on file    Active member of club or organization: Not on file    Attends meetings of clubs or organizations: Not on file    Relationship status: Not on file  . Intimate partner violence:    Fear of current or ex partner: Not on file    Emotionally abused: Not on file    Physically abused: Not on file    Forced sexual activity: Not on file  Other Topics Concern  . Not on file  Social History Narrative  . Not on file    Past Surgical History:  Procedure Laterality Date  . EXTERNAL EAR SURGERY    . TUBAL LIGATION  Family History  Problem Relation Age of Onset  . Diabetes Sister   . Colon cancer Neg Hx     No Known Allergies  Current Outpatient Medications on File Prior to Visit  Medication Sig Dispense Refill  . cyclobenzaprine (FLEXERIL) 5 MG tablet Take 1 tablet (5 mg total) by mouth at bedtime. 10 tablet 0  . meloxicam (MOBIC) 7.5 MG tablet Take 1 or 2 daily as needed for knee pain 60 tablet 0  . meloxicam (MOBIC) 7.5 MG tablet 1-2 tab po q day 15 tablet 0  . MULTIPLE VITAMIN PO Take 1 tablet by mouth daily.     Current Facility-Administered Medications on File Prior to Visit  Medication Dose Route Frequency Provider Last Rate Last Dose  . 0.9 %  sodium chloride infusion  500 mL Intravenous Continuous Armbruster, Carlota Raspberry, MD        BP 122/66 (BP Location: Left Arm, Patient Position: Sitting, Cuff Size: Small)   Pulse 65   Temp 97.8 F (36.6 C) (Oral)   Resp 16   Ht 5'  0.5" (1.537 m)   Wt 143 lb 3.2 oz (65 kg)   SpO2 100%   BMI 27.51 kg/m       Objective:   Physical Exam  General  Mental Status - Alert. General Appearance - Well groomed. Not in acute distress.  Skin Rashes- No Rashes.  HEENT Head- Normal. Ear Auditory Canal - Left- Normal. Right - Normal.Tympanic Membrane- Left- Normal. Right- Normal. Eye Sclera/Conjunctiva- Left- Normal. Right- Normal. Nose & Sinuses Nasal Mucosa- Left-  Boggy and Congested. Right-  Boggy and  Congested.Bilateral  No maxillary but faint frontal sinus pressure. Mouth & Throat Lips: Upper Lip- Normal: no dryness, cracking, pallor, cyanosis, or vesicular eruption. Lower Lip-Normal: no dryness, cracking, pallor, cyanosis or vesicular eruption. Buccal Mucosa- Bilateral- No Aphthous ulcers. Oropharynx- No Discharge or Erythema. Tonsils: Characteristics- Bilateral- No Erythema or Congestion. Size/Enlargement- Bilateral- No enlargement. Discharge- bilateral-None.  Neck Neck- Supple. No Masses.   Chest and Lung Exam Auscultation: Breath Sounds:-Clear even and unlabored.  Cardiovascular Auscultation:Rythm- Regular, rate and rhythm. Murmurs & Other Heart Sounds:Ausculatation of the heart reveal- No Murmurs.  Lymphatic Head & Neck General Head & Neck Lymphatics: Bilateral: Description- No Localized lymphadenopathy.   Neurologic Cranial Nerve exam:- CN III-XII intact(No nystagmus), symmetric smile. .Finger to Nose:- Normal/Intact Strength:- 5/5 equal and symmetric strength both upper and lower extremities.      Assessment & Plan:  You do  appear to have probable tension headache.  Your headaches did respond to meloxicam and Flexeril.  He had a significant six-day break from headaches and return of headaches just recently this sunday.  The CT of your head was negative.  I am refilling your meloxicam and Flexeril to use if needed as prescribed.  You have very 3/10  level headache and desire medication to  stop this presently so we gave you Toradol 30 mg IM.  For your recent sneezing and frontal region headaches, I want to give you a prescription of Flonase to use daily to help in the event you have some allergic rhinitis symptoms contributing to frontal sinus headache.  Remind her that if you have any headaches that are severe HA  with any gross motor or sensory function symptoms then be seen at the ED.  Follow-up as regular scheduled with your PCP or as needed.  Mackie Pai, PA-C

## 2017-06-18 NOTE — Patient Instructions (Addendum)
You do  appear to have probable tension headache.  Your headaches did respond to meloxicam and Flexeril.  He had a significant six-day break from headaches and return of headaches just recently this sunday.  The CT of your head was negative.  I am refilling your meloxicam and Flexeril to use if needed as prescribed.  You have very 3/10  level headache and desire medication to stop this presently so we gave you Toradol 30 mg IM.  For your recent sneezing and frontal region headaches, I want to give you a prescription of Flonase to use daily to help in the event you have some allergic rhinitis symptoms contributing to frontal sinus headache.  Remind her that if you have any headaches that are severe HA  with any gross motor or sensory function symptoms then be seen at the ED.  Follow-up as regular scheduled with your PCP or as needed.

## 2017-10-18 NOTE — Progress Notes (Addendum)
[place] [facility] [place], [place], [place] [address] [phone]  Date:  10/22/2017   Name:  Dai Apel   DOB:  1955/06/29   MRN:  [id number]  PCP:  Darreld Mclean, MD    Chief Complaint: Annual Exam and Cough (dry non productive cough, few months, head pressure, taking nyquiol )   History of Present Illness:  Maridel Pixler is a 62 y.o. very pleasant female patient who presents with the following:  CPE today History of pre-diabetes and OA in her hands Last seen here in May for a HA, I last saw her in 2017 She is a school custodian Her daughter helps with interpretation today as there is a language barrier  Pap: 10/16 Mammo: 8/17 Colon: 10/17 Labs: some done in April but she needs a lipid panel - she is fasting today Immun: flu, tdap needed - will give to her today  She has a sense of bladder prolapse for at least 6 months. We did a referral to OBG but they did not get an appt- may be related to language barrier.  Today Carmaleta also notes that "my period came back 2 months ago, very light bleeding."   She has noted a "dry cough" for the last 2-3 months - taking dayquil, nyquil but this does not seem to help  No fever or chills She is otherwise feeling fine today   Patient Active Problem List   Diagnosis Date Noted  . Pre-diabetes 10/14/2015  . Osteoarthritis of both hands 10/14/2015    No past medical history on file.  Past Surgical History:  Procedure Laterality Date  . EXTERNAL EAR SURGERY    . TUBAL LIGATION      Social History   Tobacco Use  . Smoking status: Never Smoker  . Smokeless tobacco: Never Used  Substance Use Topics  . Alcohol use: No    Alcohol/week: 0.0 standard drinks  . Drug use: No    Family History  Problem Relation Age of Onset  . Diabetes Sister   . Colon cancer Neg Hx     No Known Allergies  Medication list has been reviewed and updated.  Current  Outpatient Medications on File Prior to Visit  Medication Sig Dispense Refill  . MULTIPLE VITAMIN PO Take 1 tablet by mouth daily.     No current facility-administered medications on file prior to visit.     Review of Systems:  As per HPI- otherwise negative. No fever or chills No CP or SOB   Physical Examination: Vitals:   10/22/17 0922  BP: 122/68  Pulse: 72  Resp: 16  Temp: 97.6 F (36.4 C)  SpO2: 98%   Vitals:   10/22/17 0922  Weight: 142 lb (64.4 kg)  Height: 5' 0.5" (1.537 m)   Body mass index is 27.28 kg/m. Ideal Body Weight: Weight in (lb) to have BMI = 25: 129.9  GEN: WDWN, NAD, Non-toxic, A & O x 3, mild overweight, looks well  HEENT: Atraumatic, Normocephalic. Neck supple. No masses, No LAD. Bilateral TM wnl, oropharynx normal.  PEERL,EOMI.   Ears and Nose: No external deformity. CV: RRR, No M/G/R. No JVD. No thrill. No extra heart sounds. PULM: CTA B, no wheezes, crackles, rhonchi. No retractions. No resp. distress. No accessory muscle use. ABD: S, NT, ND,. No rebound. No HSM. EXTR: No c/c/e NEURO Normal gait.  PSYCH: Normally interactive. Conversant. Not depressed or anxious appearing.  Calm demeanor.  Pelvic: normal, no vaginal lesions or discharge. Uterus normal, no CMT, no adnexal tendereness or masses She does have mild bladder prolapse with cough, and her cervix is prominent suggesting that she may have a tendency towards uterine prolapse as well     Assessment and Plan: Physical exam  Screening for hyperlipidemia - Plan: Lipid panel  Screening for breast cancer - Plan: MM 3D SCREEN BREAST BILATERAL  Immunization due - Plan: Flu Vaccine QUAD 6+ mos PF IM (Fluarix Quad PF), Tdap vaccine greater than or equal to 7yo IM  Screening for cervical cancer - Plan: Cytology - PAP  Cough - Plan: montelukast (SINGULAIR) 10 MG tablet  Post-menopausal bleeding  CPE today Due for a lipid panel, flu and tetanus shots - all given today Ordered  mammo Pap today Concern for bladder prolapse and also more recently PMB.  Made her a GYN appt and gave info to pt and her daughter in person today. Explained that PMB may indicate endometrial cancer so appropriate follow-up is essential  Will have her try singulair for her cough- she will let me know if not helpful  See patient instructions for more details.   Will plan further follow- up pending labs.   Signed Lamar Blinks, MD  Received hier lipids Results for orders placed or performed in visit on 10/22/17  Lipid panel  Result Value Ref Range   Cholesterol 198 0 - 200 mg/dL   Triglycerides 64.0 0.0 - 149.0 mg/dL   HDL 88.20 >39.00 mg/dL   VLDL 12.8 0.0 - 40.0 mg/dL   LDL Cholesterol 97 0 - 99 mg/dL   Total CHOL/HDL Ratio 2    NonHDL 110.26    Letter to pt  I also go the following message from our imaging dept when I sent her for a screening mammo:  Caitrin came in today with her daughter to schedule a screening mammogram. The patient had a diagnostic mammogram and ultrasound 10/01/15. She was supposed to go back for a six month diagnostic right mammogram and possible ultrasound.  Looks like the patient didn't do that. [facility] can have a routine screening. I relayed that to her daughter Alma Friendly, who accompanied her. I told her that your office would be scheduling this for her.   I will order diagnostic mammo for her

## 2017-10-22 ENCOUNTER — Ambulatory Visit (INDEPENDENT_AMBULATORY_CARE_PROVIDER_SITE_OTHER): Payer: BC Managed Care – PPO | Admitting: Family Medicine

## 2017-10-22 ENCOUNTER — Encounter: Payer: Self-pay | Admitting: Family Medicine

## 2017-10-22 ENCOUNTER — Other Ambulatory Visit (HOSPITAL_COMMUNITY)
Admission: RE | Admit: 2017-10-22 | Discharge: 2017-10-22 | Disposition: A | Discharge status: Home / Self Care | Payer: BC Managed Care – PPO | Source: Ambulatory Visit | Attending: Family Medicine | Admitting: Family Medicine

## 2017-10-22 VITALS — BP 122/68 | HR 72 | Temp 97.6°F | Resp 16 | Ht 60.5 in | Wt 142.0 lb

## 2017-10-22 DIAGNOSIS — Z124 Encounter for screening for malignant neoplasm of cervix: Secondary | ICD-10-CM

## 2017-10-22 DIAGNOSIS — Z1231 Encounter for screening mammogram for malignant neoplasm of breast: Secondary | ICD-10-CM | POA: Diagnosis not present

## 2017-10-22 DIAGNOSIS — Z23 Encounter for immunization: Secondary | ICD-10-CM | POA: Diagnosis not present

## 2017-10-22 DIAGNOSIS — R05 Cough: Secondary | ICD-10-CM

## 2017-10-22 DIAGNOSIS — Z1239 Encounter for other screening for malignant neoplasm of breast: Secondary | ICD-10-CM

## 2017-10-22 DIAGNOSIS — Z0000 Encounter for general adult medical examination without abnormal findings: Secondary | ICD-10-CM | POA: Diagnosis not present

## 2017-10-22 DIAGNOSIS — N950 Postmenopausal bleeding: Secondary | ICD-10-CM

## 2017-10-22 DIAGNOSIS — N870 Mild cervical dysplasia: Secondary | ICD-10-CM | POA: Diagnosis not present

## 2017-10-22 DIAGNOSIS — Z13220 Encounter for screening for lipoid disorders: Secondary | ICD-10-CM

## 2017-10-22 DIAGNOSIS — R059 Cough, unspecified: Secondary | ICD-10-CM

## 2017-10-22 LAB — LIPID PANEL
CHOL/HDL RATIO: 2
Cholesterol: 198 mg/dL (ref 0–200)
HDL: 88.2 mg/dL (ref >39.00)
LDL CALC: 97 mg/dL (ref 0–99)
NonHDL: 110.26
TRIGLYCERIDES: 64 mg/dL (ref 0.0–149.0)
VLDL: 12.8 mg/dL (ref 0.0–40.0)

## 2017-10-22 MED ORDER — MONTELUKAST SODIUM 10 MG PO TABS: 10.0000 mg | ORAL_TABLET | Freq: Every day | ORAL | 6 refills | Status: DC

## 2017-10-22 NOTE — Addendum Note (Signed)
Addended by: Lamar Blinks C on: 10/22/2017 09:28 PM   Modules accepted: Orders

## 2017-10-22 NOTE — Patient Instructions (Addendum)
It was good to see you today- I will be in touch with your pap result and your blood work asap  Please also get your mammogram as soon as you are able You got your flu and tetanus shots today  Please try the singulair that I gave you today for your cough- let me know if not helpful for you  You have an appt next Thurs 9/19 at 9am with the GYN office down the hall from my office    Health Maintenance for Postmenopausal Women Menopause is a normal process in which your reproductive ability comes to an end. This process happens gradually over a span of months to years, usually between the ages of 26 and 52. Menopause is complete when you have missed 12 consecutive menstrual periods. It is important to talk with your health care provider about some of the most common conditions that affect postmenopausal women, such as heart disease, cancer, and bone loss (osteoporosis). Adopting a healthy lifestyle and getting preventive care can help to promote your health and wellness. Those actions can also lower your chances of developing some of these common conditions. What should I know about menopause? During menopause, you may experience a number of symptoms, such as:  Moderate-to-severe hot flashes.  Night sweats.  Decrease in sex drive.  Mood swings.  Headaches.  Tiredness.  Irritability.  Memory problems.  Insomnia.  Choosing to treat or not to treat menopausal changes is an individual decision that you make with your health care provider. What should I know about hormone replacement therapy and supplements? Hormone therapy products are effective for treating symptoms that are associated with menopause, such as hot flashes and night sweats. Hormone replacement carries certain risks, especially as you become older. If you are thinking about using estrogen or estrogen with progestin treatments, discuss the benefits and risks with your health care provider. What should I know about heart  disease and stroke? Heart disease, heart attack, and stroke become more likely as you age. This may be due, in part, to the hormonal changes that your body experiences during menopause. These can affect how your body processes dietary fats, triglycerides, and cholesterol. Heart attack and stroke are both medical emergencies. There are many things that you can do to help prevent heart disease and stroke:  Have your blood pressure checked at least every 1-2 years. High blood pressure causes heart disease and increases the risk of stroke.  If you are 72-68 years old, ask your health care provider if you should take aspirin to prevent a heart attack or a stroke.  Do not use any tobacco products, including cigarettes, chewing tobacco, or electronic cigarettes. If you need help quitting, ask your health care provider.  It is important to eat a healthy diet and maintain a healthy weight. ? Be sure to include plenty of vegetables, fruits, low-fat dairy products, and lean protein. ? Avoid eating foods that are high in solid fats, added sugars, or salt (sodium).  Get regular exercise. This is one of the most important things that you can do for your health. ? Try to exercise for at least 150 minutes each week. The type of exercise that you do should increase your heart rate and make you sweat. This is known as moderate-intensity exercise. ? Try to do strengthening exercises at least twice each week. Do these in addition to the moderate-intensity exercise.  Know your numbers.Ask your health care provider to check your cholesterol and your blood glucose. Continue to have  your blood tested as directed by your health care provider.  What should I know about cancer screening? There are several types of cancer. Take the following steps to reduce your risk and to catch any cancer development as early as possible. Breast Cancer  Practice breast self-awareness. ? This means understanding how your breasts  normally appear and feel. ? It also means doing regular breast self-exams. Let your health care provider know about any changes, no matter how small.  If you are 25 or older, have a clinician do a breast exam (clinical breast exam or CBE) every year. Depending on your age, family history, and medical history, it may be recommended that you also have a yearly breast X-ray (mammogram).  If you have a family history of breast cancer, talk with your health care provider about genetic screening.  If you are at high risk for breast cancer, talk with your health care provider about having an MRI and a mammogram every year.  Breast cancer (BRCA) gene test is recommended for women who have family members with BRCA-related cancers. Results of the assessment will determine the need for genetic counseling and BRCA1 and for BRCA2 testing. BRCA-related cancers include these types: ? Breast. This occurs in males or females. ? Ovarian. ? Tubal. This may also be called fallopian tube cancer. ? Cancer of the abdominal or pelvic lining (peritoneal cancer). ? Prostate. ? Pancreatic.  Cervical, Uterine, and Ovarian Cancer Your health care provider may recommend that you be screened regularly for cancer of the pelvic organs. These include your ovaries, uterus, and vagina. This screening involves a pelvic exam, which includes checking for microscopic changes to the surface of your cervix (Pap test).  For women ages 21-65, health care providers may recommend a pelvic exam and a Pap test every three years. For women ages 47-65, they may recommend the Pap test and pelvic exam, combined with testing for human papilloma virus (HPV), every five years. Some types of HPV increase your risk of cervical cancer. Testing for HPV may also be done on women of any age who have unclear Pap test results.  Other health care providers may not recommend any screening for nonpregnant women who are considered low risk for pelvic cancer  and have no symptoms. Ask your health care provider if a screening pelvic exam is right for you.  If you have had past treatment for cervical cancer or a condition that could lead to cancer, you need Pap tests and screening for cancer for at least 20 years after your treatment. If Pap tests have been discontinued for you, your risk factors (such as having a new sexual partner) need to be reassessed to determine if you should start having screenings again. Some women have medical problems that increase the chance of getting cervical cancer. In these cases, your health care provider may recommend that you have screening and Pap tests more often.  If you have a family history of uterine cancer or ovarian cancer, talk with your health care provider about genetic screening.  If you have vaginal bleeding after reaching menopause, tell your health care provider.  There are currently no reliable tests available to screen for ovarian cancer.  Lung Cancer Lung cancer screening is recommended for adults 40-67 years old who are at high risk for lung cancer because of a history of smoking. A yearly low-dose CT scan of the lungs is recommended if you:  Currently smoke.  Have a history of at least 30 pack-years of  smoking and you currently smoke or have quit within the past 15 years. A pack-year is smoking an average of one pack of cigarettes per day for one year.  Yearly screening should:  Continue until it has been 15 years since you quit.  Stop if you develop a health problem that would prevent you from having lung cancer treatment.  Colorectal Cancer  This type of cancer can be detected and can often be prevented.  Routine colorectal cancer screening usually begins at age 37 and continues through age 37.  If you have risk factors for colon cancer, your health care provider may recommend that you be screened at an earlier age.  If you have a family history of colorectal cancer, talk with your  health care provider about genetic screening.  Your health care provider may also recommend using home test kits to check for hidden blood in your stool.  A small camera at the end of a tube can be used to examine your colon directly (sigmoidoscopy or colonoscopy). This is done to check for the earliest forms of colorectal cancer.  Direct examination of the colon should be repeated every 5-10 years until age 42. However, if early forms of precancerous polyps or small growths are found or if you have a family history or genetic risk for colorectal cancer, you may need to be screened more often.  Skin Cancer  Check your skin from head to toe regularly.  Monitor any moles. Be sure to tell your health care provider: ? About any new moles or changes in moles, especially if there is a change in a mole's shape or color. ? If you have a mole that is larger than the size of a pencil eraser.  If any of your family members has a history of skin cancer, especially at a young age, talk with your health care provider about genetic screening.  Always use sunscreen. Apply sunscreen liberally and repeatedly throughout the day.  Whenever you are outside, protect yourself by wearing long sleeves, pants, a wide-brimmed hat, and sunglasses.  What should I know about osteoporosis? Osteoporosis is a condition in which bone destruction happens more quickly than new bone creation. After menopause, you may be at an increased risk for osteoporosis. To help prevent osteoporosis or the bone fractures that can happen because of osteoporosis, the following is recommended:  If you are 61-48 years old, get at least 1,000 mg of calcium and at least 600 mg of vitamin D per day.  If you are older than age 64 but younger than age 20, get at least 1,200 mg of calcium and at least 600 mg of vitamin D per day.  If you are older than age 16, get at least 1,200 mg of calcium and at least 800 mg of vitamin D per day.  Smoking  and excessive alcohol intake increase the risk of osteoporosis. Eat foods that are rich in calcium and vitamin D, and do weight-bearing exercises several times each week as directed by your health care provider. What should I know about how menopause affects my mental health? Depression may occur at any age, but it is more common as you become older. Common symptoms of depression include:  Low or sad mood.  Changes in sleep patterns.  Changes in appetite or eating patterns.  Feeling an overall lack of motivation or enjoyment of activities that you previously enjoyed.  Frequent crying spells.  Talk with your health care provider if you think that you are  experiencing depression. What should I know about immunizations? It is important that you get and maintain your immunizations. These include:  Tetanus, diphtheria, and pertussis (Tdap) booster vaccine.  Influenza every year before the flu season begins.  Pneumonia vaccine.  Shingles vaccine.  Your health care provider may also recommend other immunizations. This information is not intended to replace advice given to you by your health care provider. Make sure you discuss any questions you have with your health care provider. Document Released: 03/24/2005 Document Revised: 08/20/2015 Document Reviewed: 11/03/2014 Elsevier Interactive Patient Education  2018 [employer].

## 2017-10-24 LAB — CYTOLOGY - PAP: HPV (WINDOPATH): NOT DETECTED

## 2017-10-26 ENCOUNTER — Encounter: Payer: Self-pay | Admitting: Family Medicine

## 2017-11-01 ENCOUNTER — Ambulatory Visit (INDEPENDENT_AMBULATORY_CARE_PROVIDER_SITE_OTHER): Payer: BC Managed Care – PPO | Admitting: Family Medicine

## 2017-11-01 ENCOUNTER — Other Ambulatory Visit (HOSPITAL_COMMUNITY)
Admission: RE | Admit: 2017-11-01 | Discharge: 2017-11-01 | Disposition: A | Discharge status: Home / Self Care | Payer: BC Managed Care – PPO | Source: Ambulatory Visit | Attending: Family Medicine | Admitting: Family Medicine

## 2017-11-01 ENCOUNTER — Other Ambulatory Visit: Payer: Self-pay

## 2017-11-01 ENCOUNTER — Encounter: Payer: Self-pay | Admitting: Family Medicine

## 2017-11-01 VITALS — BP 127/65 | HR 62 | Ht 61.0 in | Wt 141.0 lb

## 2017-11-01 DIAGNOSIS — N950 Postmenopausal bleeding: Secondary | ICD-10-CM | POA: Insufficient documentation

## 2017-11-01 DIAGNOSIS — N8110 Cystocele, unspecified: Secondary | ICD-10-CM | POA: Diagnosis not present

## 2017-11-01 NOTE — Addendum Note (Signed)
Addended by: Crosby Oyster on: 11/01/2017 01:00 PM   Modules accepted: Orders

## 2017-11-01 NOTE — Progress Notes (Signed)
   Subjective:    Patient ID: Katelyn Phillips, female    DOB: 1955/05/07, 62 y.o.   MRN: [id number]  HPI Patient referred for postmenopausal bleeding and bladder prolapse.  Was seen by her primary care physician earlier this month.  She states that she went through menopause approximately 20 years ago and has had no bleeding since then.  Beginning of the month, had 2 weeks of light bleeding, then resolved.  Denies cramping, pain, abnormal discharge.  This never happened before.  She also has a sensation of feeling like her bladder is prolapsed -feels a "ball" of some sort but denies difficulty urinating, hesitancy, incomplete bladder emptying.  I have reviewed the patients past medical, family, and social history.  I have reviewed the patient's medication list and allergies.    Review of Systems  All other systems reviewed and are negative.      Objective:   Physical Exam  Constitutional: She is oriented to person, place, and time. She appears well-developed and well-nourished.  Cardiovascular: Normal rate.  Pulmonary/Chest: Effort normal.  Abdominal: Soft. She exhibits no distension and no mass. There is no tenderness. There is no rebound and no guarding. Hernia confirmed negative in the right inguinal area and confirmed negative in the left inguinal area.  Genitourinary: There is no rash, tenderness, lesion or injury on the right labia. There is no rash, tenderness, lesion or injury on the left labia. Uterus is not deviated, not enlarged, not fixed and not tender. Cervix exhibits no motion tenderness, no discharge and no friability. Right adnexum displays no mass, no tenderness and no fullness. Left adnexum displays no mass, no tenderness and no fullness. No erythema, tenderness or bleeding in the vagina. No foreign body in the vagina. No signs of injury around the vagina. No vaginal discharge found.  Genitourinary Comments: Cervix is low in the vagina -mild prolapse.  She also has a  grade 1 bladder prolapse.  Lymphadenopathy: No inguinal adenopathy noted on the right or left side.  Neurological: She is alert and oriented to person, place, and time.  Skin: Skin is warm and dry.   ENDOMETRIAL BIOPSY     The indications for endometrial biopsy were reviewed.   Risks of the biopsy including cramping, bleeding, infection, uterine perforation, inadequate specimen and need for additional procedures  were discussed. The patient states she understands and agrees to undergo procedure today. Consent was signed. Time out was performed. Urine HCG was negative. A sterile speculum was placed in the patient's vagina and the cervix was prepped with Betadine. A single-toothed tenaculum was placed on the anterior lip of the cervix to stabilize it. The 3 mm pipelle was introduced into the endometrial cavity without difficulty to a depth of 10cm, and a moderate amount of tissue was obtained and sent to pathology. The instruments were removed from the patient's vagina. Minimal bleeding from the cervix was noted. The patient tolerated the procedure well. Routine post-procedure instructions were given to the patient. The patient will follow up to review the results and for further management.        Assessment & Plan:  1. Postmenopausal bleeding Endometrial biopsy done.  Will get ultrasound.  Patient has no continued bleeding.  Will watch for now. - US PELVIS TRANSVANGINAL NON-OB (TV ONLY); Future  2. Acquired female bladder prolapse May be amenable for pessary.  We will have patient see Dr. Ihor Dow for evaluation. - US PELVIS TRANSVANGINAL NON-OB (TV ONLY); Future

## 2017-11-01 NOTE — Progress Notes (Signed)
Pt referred from Dr Edilia Bo for post menopausal bleeding and protruding bladder. Pt states she is not currently bleeding.   Crosby Oyster, RN

## 2017-11-08 ENCOUNTER — Other Ambulatory Visit: Payer: Self-pay | Admitting: Family Medicine

## 2017-11-08 DIAGNOSIS — Z1239 Encounter for other screening for malignant neoplasm of breast: Secondary | ICD-10-CM

## 2017-11-08 DIAGNOSIS — N6320 Unspecified lump in the left breast, unspecified quadrant: Secondary | ICD-10-CM

## 2017-11-10 ENCOUNTER — Ambulatory Visit (HOSPITAL_BASED_OUTPATIENT_CLINIC_OR_DEPARTMENT_OTHER)
Admission: RE | Admit: 2017-11-10 | Discharge: 2017-11-10 | Disposition: A | Discharge status: Home / Self Care | Payer: BC Managed Care – PPO | Source: Ambulatory Visit | Attending: Family Medicine | Admitting: Family Medicine

## 2017-11-10 DIAGNOSIS — N8110 Cystocele, unspecified: Secondary | ICD-10-CM

## 2017-11-10 DIAGNOSIS — N950 Postmenopausal bleeding: Secondary | ICD-10-CM | POA: Diagnosis present

## 2017-11-10 DIAGNOSIS — R9341 Abnormal radiologic findings on diagnostic imaging of renal pelvis, ureter, or bladder: Secondary | ICD-10-CM | POA: Insufficient documentation

## 2017-11-14 ENCOUNTER — Other Ambulatory Visit: Payer: Self-pay | Admitting: Family Medicine

## 2017-11-14 DIAGNOSIS — N63 Unspecified lump in unspecified breast: Secondary | ICD-10-CM

## 2017-11-16 ENCOUNTER — Ambulatory Visit
Admission: RE | Admit: 2017-11-16 | Discharge: 2017-11-16 | Disposition: A | Discharge status: Home / Self Care | Payer: BC Managed Care – PPO | Source: Ambulatory Visit | Attending: Family Medicine | Admitting: Family Medicine

## 2017-11-16 DIAGNOSIS — N6320 Unspecified lump in the left breast, unspecified quadrant: Secondary | ICD-10-CM

## 2017-11-16 DIAGNOSIS — N63 Unspecified lump in unspecified breast: Secondary | ICD-10-CM

## 2017-11-26 ENCOUNTER — Ambulatory Visit: Payer: Self-pay | Admitting: Obstetrics & Gynecology

## 2017-12-05 ENCOUNTER — Encounter: Payer: Self-pay | Admitting: Obstetrics & Gynecology

## 2017-12-05 ENCOUNTER — Ambulatory Visit (INDEPENDENT_AMBULATORY_CARE_PROVIDER_SITE_OTHER): Payer: BC Managed Care – PPO | Admitting: Obstetrics & Gynecology

## 2017-12-05 VITALS — BP 109/63 | HR 64 | Resp 16 | Ht 61.0 in | Wt 140.1 lb

## 2017-12-05 DIAGNOSIS — N814 Uterovaginal prolapse, unspecified: Secondary | ICD-10-CM | POA: Diagnosis not present

## 2017-12-05 NOTE — Progress Notes (Signed)
History:  62 y.o. G3P3 here today for eval of prolapse. Pt is Spanish speaking . She is here with her daughter who is interpreting for her. Pt reports leaking urine. She weara a pad at all times. She does not want to consider surgery, at present but, was referred to have a pessary fitted.    The following portions of the patient's history were reviewed and updated as appropriate: allergies, current medications, past family history, past medical history, past social history, past surgical history and problem list.  Review of Systems:  Pertinent items are noted in HPI.    Objective:  Physical Exam Blood pressure 109/63, pulse 64, resp. rate 16, height 5\' 1"  (1.549 m), weight 140 lb 1.9 oz (63.6 kg).  CONSTITUTIONAL: Well-developed, well-nourished female in no acute distress.  HENT:  Normocephalic, atraumatic EYES: Conjunctivae and EOM are normal. No scleral icterus.  NECK: Normal range of motion SKIN: Skin is warm and dry. No rash noted. Not diaphoretic.No pallor. [place]: Alert and oriented to person, place, and time. Normal coordination.  Abd: Soft, nontender and nondistended Pelvic: Normal appearing external genitalia; normal appearing vaginal mucosa and cervix.  Normal discharge.  Small uterus, no other palpable masses, no uterine or adnexal tenderness. The uterus is prolapsed to the introitus. There is >90 degrees on the q-tip test. There is a grade III cystocele.     PESSARY FITTING AND PLACEMENT During pelvic exam, patient was examined. Her vaginal introitus size, vaginal length, and prolapse stage wereused to guide selection of pessary type and size. After evaluation, a #3 ring with support pessary fitting guide was placed and patient walked around room with it in place.  No prolapse was noted in standing, or in lithotomy position even after Valsalva.  The fitter was removed the regular  #3 ring support pessary was ordered.   Labs and Imaging US Pelvis Transvanginal Non-ob (tv  Only)  Result Date: 11/12/2017 CLINICAL DATA:  Postmenopausal bleeding EXAM: ULTRASOUND PELVIS TRANSVAGINAL TECHNIQUE: Transvaginal ultrasound examination of the pelvis was performed including evaluation of the uterus, ovaries, adnexal regions, and pelvic cul-de-sac. COMPARISON:  None. FINDINGS: Uterus Measurements: 6.8 x 2.7 x 3.8 cm. No fibroids or other mass visualized. Endometrium Thickness: 3 mm in thickness. Small amount of fluid within the endometrial canal within the fundus. No focal abnormality visualized. Right ovary Measurements: Not visualized.  No adnexal mass seen. Left ovary Measurements: 1.5 x 1.3 x 1.1 cm. Normal appearance/no adnexal mass. Other findings: Incomplete emptying of the bladder demonstrates irregular bladder wall. IMPRESSION: No focal endometrial abnormality or thickness. Trace fluid within the endometrial canal in the fundus. Irregular appearing bladder wall. Recommend clinical correlation. This could be further evaluated with cystoscopy if felt clinically indicated. Electronically Signed   By: Rolm Baptise M.D.   On: 11/12/2017 07:45   US Breast Ltd Uni Right Inc Axilla  Result Date: 11/16/2017 CLINICAL DATA:  62 year old female presenting for follow-up of a probably benign right breast mass. EXAM: DIGITAL DIAGNOSTIC BILATERAL MAMMOGRAM WITH CAD AND TOMO ULTRASOUND RIGHT BREAST COMPARISON:  Previous exam(s). ACR Breast Density Category c: The breast tissue is heterogeneously dense, which may obscure small masses. FINDINGS: The mass in the superior right breast appears slightly smaller mammographically. No suspicious calcifications, masses or areas of distortion are seen in the bilateral breasts. Mammographic images were processed with CAD. Ultrasound of the right breast at 12 o'clock, 3 cm from the nipple demonstrates that the small hypoechoic mass measures 7 x 3 x 5 mm, previously 7 x  5 x 7 mm. IMPRESSION: 1. Interval decrease in size of the right breast mass at 12 o'clock. 2.   No mammographic evidence of malignancy in the bilateral breasts. RECOMMENDATION: Screening mammogram in one year.(Code:SM-B-01Y) I have discussed the findings and recommendations with the patient. Results were also provided in writing at the conclusion of the visit. If applicable, a reminder letter will be sent to the patient regarding the next appointment. BI-RADS CATEGORY  2: Benign. Electronically Signed   By: Ammie Ferrier M.D.   On: 11/16/2017 09:00   Mm Diag Breast Tomo Bilateral  Result Date: 11/16/2017 CLINICAL DATA:  62 year old female presenting for follow-up of a probably benign right breast mass. EXAM: DIGITAL DIAGNOSTIC BILATERAL MAMMOGRAM WITH CAD AND TOMO ULTRASOUND RIGHT BREAST COMPARISON:  Previous exam(s). ACR Breast Density Category c: The breast tissue is heterogeneously dense, which may obscure small masses. FINDINGS: The mass in the superior right breast appears slightly smaller mammographically. No suspicious calcifications, masses or areas of distortion are seen in the bilateral breasts. Mammographic images were processed with CAD. Ultrasound of the right breast at 12 o'clock, 3 cm from the nipple demonstrates that the small hypoechoic mass measures 7 x 3 x 5 mm, previously 7 x 5 x 7 mm. IMPRESSION: 1. Interval decrease in size of the right breast mass at 12 o'clock. 2.  No mammographic evidence of malignancy in the bilateral breasts. RECOMMENDATION: Screening mammogram in one year.(Code:SM-B-01Y) I have discussed the findings and recommendations with the patient. Results were also provided in writing at the conclusion of the visit. If applicable, a reminder letter will be sent to the patient regarding the next appointment. BI-RADS CATEGORY  2: Benign. Electronically Signed   By: Ammie Ferrier M.D.   On: 11/16/2017 09:00    Assessment & Plan:  Pelvic organ prolapse. Pt will f/u once pessary is received.   Total face-to-face time with patient was 45 min.  Greater than 50%  was spent in counseling and coordination of care with the patient.   Fionn Stracke L. Harraway-Smith, M.D., Cherlynn June

## 2017-12-05 NOTE — Patient Instructions (Signed)
Prolapso de los rganos de la pelvis (Pelvic Organ Prolapse) El prolapso de los rganos de la pelvis es el estiramiento, la dilatacin o la cada de los rganos de la pelvis a una posicin anormal. Esto sucede cuando los msculos y tejidos que rodean y sostienen las estructuras plvicas se estiran o se debilitan. El prolapso de los rganos de la pelvis puede involucrar los siguientes rganos:  Vagina (prolapso vaginal).  tero (prolapso uterino).  Vejiga (cistocele).  Recto (rectocele).  Intestinos (enterocele). Cuando estn comprometidos rganos que no son la vagina, estos a menudo se protruyen hacia adentro o hacia afuera de la vagina, dependiendo de la gravedad del prolapso. CAUSAS Algunas causas de esta enfermedad incluyen lo siguiente:  [place].  Tos prolongada (crnica).  Estreimiento crnico.  Obesidad.  Ciruga de pelvis anterior.  Envejecimiento. Durante y despus de la menopausia, una disminucin en la produccin de Therapist, occupational puede debilitar los msculos y los ligamentos plvicos.  Levantar sistemticamente objetos pesados de ms de 50libras (23kg).  Acumulacin de lquido en el abdomen debido a ciertas enfermedades y a Media planner. SNTOMAS Los sntomas de esta afeccin incluyen lo siguiente:  Prdida del control de la vejiga al toser, Brewing technologist, hacer un esfuerzo y Field seismologist ejercicio (incontinencia urinaria de esfuerzo). Esto puede empeorar inmediatamente despus del nacimiento y Teacher, English as a foreign language gradualmente con Physiological scientist.  Sensacin de presin en la pelvis o la vagina. Esta presin puede aumentar al toser o defecar.  Una protuberancia que sobresale desde la apertura de la vagina o contra la pared vaginal. Si el tero sobresale a travs de la apertura de la vagina y roza la ropa, tambin puede padecer irritacin, lceras, infeccin, dolor y [place].  Mayor esfuerzo para Systems developer.  Dolor en la parte inferior de la  espalda.  Dolor, molestias o Midwife.  Infecciones reiteradas en la vejiga (infecciones en las vas urinarias).  Dificultad o incapacidad para colocar un tampn o un aplicador. En algunas personas, esta afeccin no produce sntomas. DIAGNSTICO El mdico puede realizarle un examen interno y externo de la vagina y el recto. Durante el examen, puede pedirle que tosa y que haga un esfuerzo mientras est [place], sentada y de pie. El mdico determinar si se requieren ms estudios, como las pruebas de la funcin de la vejiga. TRATAMIENTO En la [employer], esta afeccin debe tratarse solo si produce sntomas. No existe ningn tratamiento que garantice que el prolapso se corregir o que alivie los sntomas por completo. El tratamiento puede incluir lo siguiente:  Cambios de estilo de vida tales como: ? Product/process development scientist las bebidas que contengan cafena. ? Aumentar la ingesta de alimentos con alto contenido de [place]. Esto puede ayudar a Transport planner estreimiento y Arboriculturist. ? Vaciar la vejiga en momentos programados (terapia de entrenamiento de la vejiga). Esto puede ayudar a reducir o Mining engineer incontinencia urinaria. ? Bajar de peso si tiene sobrepeso o es obeso.  Estrgeno. Si el prolapso es leve, el estrgeno puede ayudar al aumentar la fuerza y el tono de los msculos del piso plvico.  Ejercicios de [place]. Estos ejercicios pueden ayudar en los casos leves de prolapso al estirar y tensar los msculos del piso plvico.  Colocacin de un pesario. Un pesario es un dispositivo blando y flexible que el mdico coloca en la vagina para ayudar a Nature conservation officer las paredes vaginales y Theatre manager los rganos de la pelvis en su Environmental consultant.  Ciruga. Esta es a menudo  la nica forma de tratamiento de los casos graves de prolapso. Existen diferentes tipos de [place] disponibles. INSTRUCCIONES PARA EL CUIDADO EN EL HOGAR  Use una toalla higinica o un producto absorbente  si tiene incontinencia urinaria.  Evite levantar objetos pesados y Armed forces training and education officer se ejercita o trabaja. No contenga la respiracin cuando levanta pesas y realiza ejercicios de intensidad leve a moderada. Limite sus actividades segn las indicaciones del [place] los medicamentos solamente como se lo haya indicado el mdico.  Practique los ejercicios de [place] como se lo haya indicado el mdico.  Si tiene un pesario, selo como se lo haya indicado el mdico. SOLICITE ATENCIN MDICA SI:  Sus sntomas interfieren con sus actividades diarias o su vida sexual.  Necesita medicamentos para Public house manager las [place].  Observa sangrado vaginal no relacionado con su menstruacin.  Tiene fiebre.  Siente dolor o tiene una hemorragia al [employer].  Observa sangrado al defecar.  Pierde orina durante las [employer].  Sufre estreimiento crnico.  Tiene un pesario colocado y Radio broadcast assistant.  Tiene secrecin vaginal con olor ftido.  Tiene dolor o clicos inusuales en la parte inferior del abdomen. Esta informacin no tiene Marine scientist el consejo del mdico. Asegrese de hacerle al mdico cualquier pregunta que tenga. Document Released: 08/27/2013 Document Revised: 08/27/2013 Document Reviewed: 04/14/2013 Elsevier Interactive Patient Education  [employer].

## 2017-12-11 ENCOUNTER — Encounter: Payer: Self-pay | Admitting: Obstetrics & Gynecology

## 2017-12-26 ENCOUNTER — Ambulatory Visit (INDEPENDENT_AMBULATORY_CARE_PROVIDER_SITE_OTHER): Payer: BC Managed Care – PPO | Admitting: Obstetrics & Gynecology

## 2017-12-26 ENCOUNTER — Encounter: Payer: Self-pay | Admitting: Obstetrics & Gynecology

## 2017-12-26 VITALS — BP 110/61 | HR 67 | Ht 61.0 in | Wt 141.1 lb

## 2017-12-26 DIAGNOSIS — N814 Uterovaginal prolapse, unspecified: Secondary | ICD-10-CM

## 2017-12-26 NOTE — Progress Notes (Signed)
HPI Pt presents today to have pessary palced.  She denies problems. She is a bit scared about the pessary but, she still wants it placed. She denies new complaints.    Review of Systems: [place]       Objective:   Physical Exam Pt in NAD GYN: Grade III uterine prolaspe. Regular  #3 ring support pessary placed.     Assessment:     Pessary placement. After placement, pt ambulated in halls and performed different movements to assess for comfort.  No problems noted. Pt was able to void with pessary in place.      Plan:     F/u in 1 months to have pessary removed and cleaned F/u sooner prn  Pt educated about pessary concerns  All questions answered.    Total face-to-face time with patient was 20 min.  Greater than 50% was spent in counseling and coordination of care with the patient.   Jullian Clayson L. Harraway-Smith, M.D., Cherlynn June

## 2017-12-26 NOTE — Progress Notes (Signed)
Language resources interpreter Mickel Baas and Pt's daughter.

## 2018-01-23 ENCOUNTER — Ambulatory Visit (INDEPENDENT_AMBULATORY_CARE_PROVIDER_SITE_OTHER): Payer: BC Managed Care – PPO | Admitting: Obstetrics & Gynecology

## 2018-01-23 ENCOUNTER — Encounter: Payer: Self-pay | Admitting: Obstetrics & Gynecology

## 2018-01-23 VITALS — BP 109/62 | HR 70 | Ht 61.0 in | Wt 140.1 lb

## 2018-01-23 DIAGNOSIS — N814 Uterovaginal prolapse, unspecified: Secondary | ICD-10-CM

## 2018-01-23 NOTE — Progress Notes (Signed)
  HPI Pt presents today to have pessary removed and cleaned.  She denies problems and reports that her pessary is working very well.  She denies further complaints.     Review of Systems       Objective:   Physical Exam Pt in NAD GYN: Pessary removed and cleaned; vaginal mucosa looks healty and with no excoriations or breakdown> THe pessary appears to be well fitted      Assessment:     Pessary check  Pt with discomfort despite adequate sized pessary. She does not leak urine any longer however, she does not like the discharge dn odor from the pessary. We discussed surgical management of the prolapse and incontinence. She would like to consider this. She is worried about the cost of the surgery. She is not worried about the clinical outcome at present.      Plan:  Referral to Dr. Mikle Bosworth for  eval and management of uterine prolapse and incontinence,.   F/u in 3 months to have pessary removed and cleaned if she opts NOT to have surgery F/u sooner prn   Total face-to-face time with patient was 25min.  Greater than 50% was spent in counseling and coordination of care with the patient.  The Spanish interpreter was delayed and pt consented to allow her daughter to translate for her.   Karrie Fluellen L. Harraway-Smith, M.D., Cherlynn June

## 2018-01-25 ENCOUNTER — Encounter: Payer: Self-pay | Admitting: Obstetrics & Gynecology

## 2018-04-08 ENCOUNTER — Ambulatory Visit: Payer: Self-pay | Admitting: Obstetrics & Gynecology

## 2018-10-14 NOTE — Progress Notes (Deleted)
[place] [facility] [address] [place], [place] [address] [phone] [phone]  Date:  10/28/2018   Name:  Katelyn Phillips   DOB:  1955-10-02   MRN:  [id number]  PCP:  Darreld Mclean, MD    Chief Complaint: No chief complaint on file.   History of Present Illness:  Katelyn Phillips is a 63 y.o. very pleasant female patient who presents with the following:  Here today for a CPE History of pre-diabetes, OA of hands, allergies She sees Dr Maylene RoesTamala Julian for her uterine prolapse and incontinence, uses a pessary  She had eval for PMB last year Last seen by myself about one year ago for a CPE  She works as a [name] Flu shot: Can recommend shingrix Labs/ HIV screening due Colon UTD mammo UTD   Patient Active Problem List   Diagnosis Date Noted  . Pre-diabetes 10/14/2015  . Osteoarthritis of both hands 10/14/2015    No past medical history on file.  Past Surgical History:  Procedure Laterality Date  . EXTERNAL EAR SURGERY    . TUBAL LIGATION      Social History   Tobacco Use  . Smoking status: Never Smoker  . Smokeless tobacco: Never Used  Substance Use Topics  . Alcohol use: No    Alcohol/week: 0.0 standard drinks  . Drug use: No    Family History  Problem Relation Age of Onset  . Diabetes Sister   . Colon cancer Neg Hx     Allergies  Allergen Reactions  . Latex Rash    Medication list has been reviewed and updated.  Current Outpatient Medications on File Prior to Visit  Medication Sig Dispense Refill  . montelukast (SINGULAIR) 10 MG tablet Take 1 tablet (10 mg total) by mouth at bedtime. (Patient not taking: Reported on 12/26/2017) 30 tablet 6  . MULTIPLE VITAMIN PO Take 1 tablet by mouth daily.     No current facility-administered medications on file prior to visit.     Review of Systems:  As per HPI- otherwise negative.   Physical Examination: There were no vitals  filed for this visit. There were no vitals filed for this visit. There is no height or weight on file to calculate BMI. Ideal Body Weight:    GEN: WDWN, NAD, Non-toxic, A & O x 3 HEENT: Atraumatic, Normocephalic. Neck supple. No masses, No LAD. Ears and Nose: No external deformity. CV: RRR, No M/G/R. No JVD. No thrill. No extra heart sounds. PULM: CTA B, no wheezes, crackles, rhonchi. No retractions. No resp. distress. No accessory muscle use. ABD: S, NT, ND, +BS. No rebound. No HSM. EXTR: No c/c/e NEURO Normal gait.  PSYCH: Normally interactive. Conversant. Not depressed or anxious appearing.  Calm demeanor.    Assessment and Plan: ***  Signed Lamar Blinks, MD

## 2018-10-28 ENCOUNTER — Encounter: Payer: BC Managed Care – PPO | Admitting: Family Medicine

## 2018-11-14 ENCOUNTER — Encounter: Payer: BC Managed Care – PPO | Admitting: Family Medicine

## 2018-11-27 ENCOUNTER — Encounter: Payer: BC Managed Care – PPO | Admitting: Family Medicine

## 2018-12-29 NOTE — Progress Notes (Addendum)
[facility] at [facility] [place], [place], [place] [address] [phone]  Date:  01/01/2019   Name:  Katelyn Phillips   DOB:  August 28, 1955   MRN:  [id number]  PCP:  Darreld Mclean, MD    Chief Complaint: Annual Exam (needs flu shot)   History of Present Illness:  Katelyn Phillips is a 63 y.o. very pleasant female patient who presents with the following:  Here today for routine physical History of arthritis, prediabetes Last seen by my in September 2019-  She works as a school custodian Last year she had postmenopausal bleeding as well as cystocele, I referred her to OB/GYN She had an endometrial biopsy September 2019 and was fitted with a pessary for her cystocele She then underwent surgery for her cystocele in August of this year - she did well and no longer needs her pessary  She notes that she still has some urinary urgency, but overall she is quite pleased with her results  Flu vaccine- give today  Mammogram 1 year ago Mildly abnormal pap least year- needs repeat today, she understands and agrees  Labs are due  No chest pain or shortness of breath Seen today with her daughter who aids with language barrier Patient Active Problem List   Diagnosis Date Noted  . Pre-diabetes 10/14/2015  . Osteoarthritis of both hands 10/14/2015    History reviewed. No pertinent past medical history.  Past Surgical History:  Procedure Laterality Date  . EXTERNAL EAR SURGERY    . TUBAL LIGATION      Social History   Tobacco Use  . Smoking status: Never Smoker  . Smokeless tobacco: Never Used  Substance Use Topics  . Alcohol use: No    Alcohol/week: 0.0 standard drinks  . Drug use: No    Family History  Problem Relation Age of Onset  . Diabetes Sister   . Colon cancer Neg Hx     Allergies  Allergen Reactions  . Latex Rash    Medication list has been reviewed and updated.  Current Outpatient  Medications on File Prior to Visit  Medication Sig Dispense Refill  . MULTIPLE VITAMIN PO Take 1 tablet by mouth daily.     No current facility-administered medications on file prior to visit.     Review of Systems:  As per HPI- otherwise negative.  No fever or chills, no chest pain or shortness of breath Physical Examination: Vitals:   01/01/19 1304  BP: 118/70  Pulse: 68  Resp: 16  Temp: (!) 97.5 F (36.4 C)  SpO2: 98%   Vitals:   01/01/19 1304  Weight: 147 lb (66.7 kg)  Height: 5\' 1"  (1.549 m)   Body mass index is 27.78 kg/m. Ideal Body Weight: Weight in (lb) to have BMI = 25: 132  GEN: WDWN, NAD, Non-toxic, A & O x 3 HEENT: Atraumatic, Normocephalic. Neck supple. No masses, No LAD. Ears and Nose: No external deformity. CV: RRR, No M/G/R. No JVD. No thrill. No extra heart sounds. PULM: CTA B, no wheezes, crackles, rhonchi. No retractions. No resp. distress. No accessory muscle use. ABD: S, NT, ND, +BS. No rebound. No HSM. EXTR: No c/c/e NEURO Normal gait.  PSYCH: Normally interactive. Conversant. Not depressed or anxious appearing.  Calm demeanor.  Pap collected today, normal external genitals, normal vagina and cervix She is bothered by uncomfortable varicose and spider veins in both lower extremities, these have been present for a few years  and are getting gradually worse Wt Readings from Last 3 Encounters:  01/01/19 147 lb (66.7 kg)  01/23/18 140 lb 1.3 oz (63.5 kg)  12/26/17 141 lb 1.3 oz (64 kg)   She is watching her weight carefully  Assessment and Plan: Physical exam  Screening for hyperlipidemia - Plan: Lipid panel  Encounter for screening mammogram for malignant neoplasm of breast - Plan: MM 3D SCREEN BREAST BILATERAL  Pre-diabetes - Plan: Comprehensive metabolic panel, Hemoglobin A1c  Screening for HIV (human immunodeficiency virus) - Plan: HIV antibody  Screening for thyroid disorder - Plan: TSH  Screening for deficiency anemia - Plan:  CBC  Screening for cervical cancer - Plan: Cytology - PAP  Varicose veins of both lower extremities with pain - Plan: Ambulatory referral to Vascular Surgery  Needs flu shot - Plan: Flu Vaccine QUAD 6+ mos PF IM (Fluarix Quad PF)  Here today for a routine physical Repeat Pap performed, labs pending Flu shot given We discussed Shingrix but decided to delay due to pandemic Referral to vascular surgery to discuss her veins  Signed Lamar Blinks, MD  Received her labs so far 11/19  Results for orders placed or performed in visit on 01/01/19  CBC  Result Value Ref Range   WBC 4.4 4.0 - 10.5 K/uL   RBC 4.11 3.87 - 5.11 Mil/uL   Platelets 247.0 150.0 - 400.0 K/uL   Hemoglobin 12.4 12.0 - 15.0 g/dL   HCT 38.1 36.0 - 46.0 %   MCV 92.5 78.0 - 100.0 fl   MCHC 32.6 30.0 - 36.0 g/dL   RDW 14.0 11.5 - 15.5 %  Comprehensive metabolic panel  Result Value Ref Range   Sodium 140 135 - 145 mEq/L   Potassium 3.8 3.5 - 5.1 mEq/L   Chloride 103 96 - 112 mEq/L   CO2 29 19 - 32 mEq/L   Glucose, Bld 102 (H) 70 - 99 mg/dL   BUN 9 6 - 23 mg/dL   Creatinine, Ser 0.54 0.40 - 1.20 mg/dL   Total Bilirubin 0.5 0.2 - 1.2 mg/dL   Alkaline Phosphatase 92 39 - 117 U/L   AST 17 0 - 37 U/L   ALT 10 0 - 35 U/L   Total Protein 7.4 6.0 - 8.3 g/dL   Albumin 4.3 3.5 - 5.2 g/dL   GFR 113.71 >60.00 mL/min   Calcium 9.8 8.4 - 10.5 mg/dL  Hemoglobin A1c  Result Value Ref Range   Hgb A1c MFr Bld 5.9 4.6 - 6.5 %  Lipid panel  Result Value Ref Range   Cholesterol 193 0 - 200 mg/dL   Triglycerides 80.0 0.0 - 149.0 mg/dL   HDL 73.80 >39.00 mg/dL   VLDL 16.0 0.0 - 40.0 mg/dL   LDL Cholesterol 103 (H) 0 - 99 mg/dL   Total CHOL/HDL Ratio 3    NonHDL 119.00   HIV antibody  Result Value Ref Range   HIV 1&2 Ab, 4th Generation NON-REACTIVE NON-REACTI  TSH  Result Value Ref Range   TSH 1.82 0.35 - 4.50 uIU/mL  Cytology - PAP  Result Value Ref Range   High risk HPV Negative    Adequacy      Satisfactory  for evaluation; transformation zone component PRESENT.   Diagnosis      - Negative for intraepithelial lesion or malignancy (NILM)   Comment Normal Reference Range HPV - Negative    The 10-year ASCVD risk score Mikey Bussing DC Jr., et al., 2013) is: 3.2%   Values used to  calculate the score:     Age: 63 years     Sex: Female     Is Non-Hispanic African American: No     Diabetic: No     Tobacco smoker: No     Systolic Blood Pressure: 123456 mmHg     Is BP treated: No     HDL Cholesterol: 73.8 mg/dL     Total Cholesterol: 193 mg/dL  Received her pap 11/22- message to pt High risk HPV Negative   Adequacy Satisfactory for evaluation; transformation zone component PRESENT.   Diagnosis - Negative for intraepithelial lesion or malignancy (NILM)   Comment Normal Reference Range HPV - Negative

## 2019-01-01 ENCOUNTER — Other Ambulatory Visit: Payer: Self-pay

## 2019-01-01 ENCOUNTER — Ambulatory Visit (INDEPENDENT_AMBULATORY_CARE_PROVIDER_SITE_OTHER): Payer: BC Managed Care – PPO | Admitting: Family Medicine

## 2019-01-01 ENCOUNTER — Other Ambulatory Visit (HOSPITAL_COMMUNITY)
Admission: RE | Admit: 2019-01-01 | Discharge: 2019-01-01 | Disposition: A | Discharge status: Home / Self Care | Payer: BC Managed Care – PPO | Source: Ambulatory Visit | Attending: Family Medicine | Admitting: Family Medicine

## 2019-01-01 ENCOUNTER — Encounter: Payer: Self-pay | Admitting: Family Medicine

## 2019-01-01 VITALS — BP 118/70 | HR 68 | Temp 97.5°F | Resp 16 | Ht 61.0 in | Wt 147.0 lb

## 2019-01-01 DIAGNOSIS — Z13220 Encounter for screening for lipoid disorders: Secondary | ICD-10-CM | POA: Diagnosis not present

## 2019-01-01 DIAGNOSIS — I83813 Varicose veins of bilateral lower extremities with pain: Secondary | ICD-10-CM

## 2019-01-01 DIAGNOSIS — Z124 Encounter for screening for malignant neoplasm of cervix: Secondary | ICD-10-CM | POA: Diagnosis not present

## 2019-01-01 DIAGNOSIS — Z0000 Encounter for general adult medical examination without abnormal findings: Secondary | ICD-10-CM | POA: Diagnosis not present

## 2019-01-01 DIAGNOSIS — R7303 Prediabetes: Secondary | ICD-10-CM

## 2019-01-01 DIAGNOSIS — Z130 Encounter for screening for diseases of the blood and blood-forming organs and certain disorders involving the immune mechanism: Secondary | ICD-10-CM

## 2019-01-01 DIAGNOSIS — Z1329 Encounter for screening for other suspected endocrine disorder: Secondary | ICD-10-CM

## 2019-01-01 DIAGNOSIS — Z114 Encounter for screening for human immunodeficiency virus [HIV]: Secondary | ICD-10-CM

## 2019-01-01 DIAGNOSIS — Z1231 Encounter for screening mammogram for malignant neoplasm of breast: Secondary | ICD-10-CM

## 2019-01-01 DIAGNOSIS — Z23 Encounter for immunization: Secondary | ICD-10-CM

## 2019-01-01 LAB — COMPREHENSIVE METABOLIC PANEL
ALT: 10 U/L (ref 0–35)
AST: 17 U/L (ref 0–37)
Albumin: 4.3 g/dL (ref 3.5–5.2)
Alkaline Phosphatase: 92 U/L (ref 39–117)
BUN: 9 mg/dL (ref 6–23)
CO2: 29 mEq/L (ref 19–32)
Calcium: 9.8 mg/dL (ref 8.4–10.5)
Chloride: 103 mEq/L (ref 96–112)
Creatinine, Ser: 0.54 mg/dL (ref 0.40–1.20)
GFR: 113.71 mL/min (ref >60.00)
Glucose, Bld: 102 mg/dL — ABNORMAL HIGH (ref 70–99)
Potassium: 3.8 mEq/L (ref 3.5–5.1)
Sodium: 140 mEq/L (ref 135–145)
Total Bilirubin: 0.5 mg/dL (ref 0.2–1.2)
Total Protein: 7.4 g/dL (ref 6.0–8.3)

## 2019-01-01 LAB — HEMOGLOBIN A1C: Hgb A1c MFr Bld: 5.9 % (ref 4.6–6.5)

## 2019-01-01 LAB — CBC
HCT: 38.1 % (ref 36.0–46.0)
Hemoglobin: 12.4 g/dL (ref 12.0–15.0)
MCHC: 32.6 g/dL (ref 30.0–36.0)
MCV: 92.5 fl (ref 78.0–100.0)
Platelets: 247 10*3/uL (ref 150.0–400.0)
RBC: 4.11 Mil/uL (ref 3.87–5.11)
RDW: 14 % (ref 11.5–15.5)
WBC: 4.4 10*3/uL (ref 4.0–10.5)

## 2019-01-01 LAB — LIPID PANEL
Cholesterol: 193 mg/dL (ref 0–200)
HDL: 73.8 mg/dL (ref >39.00)
LDL Cholesterol: 103 mg/dL — ABNORMAL HIGH (ref 0–99)
NonHDL: 119
Total CHOL/HDL Ratio: 3
Triglycerides: 80 mg/dL (ref 0.0–149.0)
VLDL: 16 mg/dL (ref 0.0–40.0)

## 2019-01-01 LAB — TSH: TSH: 1.82 u[IU]/mL (ref 0.35–4.50)

## 2019-01-02 ENCOUNTER — Encounter: Payer: Self-pay | Admitting: Family Medicine

## 2019-01-02 LAB — HIV ANTIBODY (ROUTINE TESTING W REFLEX): HIV 1&2 Ab, 4th Generation: NONREACTIVE

## 2019-01-03 LAB — CYTOLOGY - PAP
Comment: NEGATIVE
Diagnosis: NEGATIVE
High risk HPV: NEGATIVE

## 2019-01-05 ENCOUNTER — Encounter: Payer: Self-pay | Admitting: Family Medicine

## 2019-01-17 ENCOUNTER — Ambulatory Visit (HOSPITAL_BASED_OUTPATIENT_CLINIC_OR_DEPARTMENT_OTHER)
Admission: RE | Admit: 2019-01-17 | Discharge: 2019-01-17 | Disposition: A | Discharge status: Home / Self Care | Payer: BC Managed Care – PPO | Source: Ambulatory Visit | Attending: Family Medicine | Admitting: Family Medicine

## 2019-01-17 ENCOUNTER — Other Ambulatory Visit: Payer: Self-pay

## 2019-01-17 DIAGNOSIS — Z1231 Encounter for screening mammogram for malignant neoplasm of breast: Secondary | ICD-10-CM

## 2020-01-06 NOTE — Patient Instructions (Addendum)
It was good to see you again today, I will be in touch with your labs as soon as possible We will set you up for a mammogram and bone density scan at the [facility] Please go to the [facility] at your convenience to have x-rays of your lower back.    Please let me know if the headaches continue to be a chronic issue. I agree that retiring from your job at this point may be a good idea.  I like that you are physically active but your job may be excessively difficult at this point   I would recommend a covid 19 booster Flu shot today    Also consider getting the shingles vaccine (shingrix) at your drug store once your medicare begins    Health Maintenance After Age 50 After age 35, you are at a higher risk for certain long-term diseases and infections as well as injuries from falls. Falls are a major cause of broken bones and head injuries in people who are older than age 27. Getting regular preventive care can help to keep you healthy and well. Preventive care includes getting regular testing and making lifestyle changes as recommended by your health care provider. Talk with your health care provider about:  Which screenings and tests you should have. A screening is a test that checks for a disease when you have no symptoms.  A diet and exercise plan that is right for you. What should I know about screenings and tests to prevent falls? Screening and testing are the best ways to find a health problem early. Early diagnosis and treatment give you the best chance of managing medical conditions that are common after age 82. Certain conditions and lifestyle choices may make you more likely to have a fall. Your health care provider may recommend:  Regular vision checks. Poor vision and conditions such as cataracts can make you more likely to have a fall. If you wear glasses, make sure to get your prescription updated if your vision changes.  Medicine  review. Work with your health care provider to regularly review all of the medicines you are taking, including over-the-counter medicines. Ask your health care provider about any side effects that may make you more likely to have a fall. Tell your health care provider if any medicines that you take make you feel dizzy or sleepy.  Osteoporosis screening. Osteoporosis is a condition that causes the bones to get weaker. This can make the bones weak and cause them to break more easily.  Blood pressure screening. Blood pressure changes and medicines to control blood pressure can make you feel dizzy.  Strength and balance checks. Your health care provider may recommend certain tests to check your strength and balance while standing, walking, or changing positions.  Foot health exam. Foot pain and numbness, as well as not wearing proper footwear, can make you more likely to have a fall.  Depression screening. You may be more likely to have a fall if you have a fear of falling, feel emotionally low, or feel unable to do activities that you used to do.  Alcohol use screening. Using too much alcohol can affect your balance and may make you more likely to have a fall. What actions can I take to lower my risk of falls? General instructions  Talk with your health care provider about your risks for falling. Tell your health care provider if: ? You fall. Be sure to tell  your health care provider about all falls, even ones that seem minor. ? You feel dizzy, sleepy, or off-balance.  Take over-the-counter and prescription medicines only as told by your health care provider. These include any supplements.  Eat a healthy diet and maintain a healthy weight. A healthy diet includes low-fat dairy products, low-fat (lean) meats, and fiber from whole grains, beans, and lots of fruits and vegetables. Home safety  Remove any tripping hazards, such as rugs, cords, and clutter.  Install safety equipment such as grab  bars in bathrooms and safety rails on stairs.  Keep rooms and walkways well-lit. Activity   Follow a regular exercise program to stay fit. This will help you maintain your balance. Ask your health care provider what types of exercise are appropriate for you.  If you need a cane or walker, use it as recommended by your health care provider.  Wear supportive shoes that have nonskid soles. Lifestyle  Do not drink alcohol if your health care provider tells you not to drink.  If you drink alcohol, limit how much you have: ? 0-1 drink a day for women. ? 0-2 drinks a day for men.  Be aware of how much alcohol is in your drink. In the [place] one drink equals one typical bottle of beer (12 oz), one-half glass of wine (5 oz), or one shot of hard liquor (1 oz).  Do not use any products that contain nicotine or tobacco, such as cigarettes and e-cigarettes. If you need help quitting, ask your health care provider. Summary  Having a healthy lifestyle and getting preventive care can help to protect your health and wellness after age 66.  Screening and testing are the best way to find a health problem early and help you avoid having a fall. Early diagnosis and treatment give you the best chance for managing medical conditions that are more common for people who are older than age 9.  Falls are a major cause of broken bones and head injuries in people who are older than age 36. Take precautions to prevent a fall at home.  Work with your health care provider to learn what changes you can make to improve your health and wellness and to prevent falls. This information is not intended to replace advice given to you by your health care provider. Make sure you discuss any questions you have with your health care provider. Document Revised: 05/23/2018 Document Reviewed: 12/13/2016 Elsevier Patient Education  2020 [employer].

## 2020-01-06 NOTE — Progress Notes (Addendum)
[facility] at [facility] [address] [place], [place] [address] [phone]  Date:  01/07/2020   Name:  Katelyn Phillips   DOB:  Jun 27, 1955   MRN:  [id number]  PCP:  Darreld Mclean, MD    Chief Complaint: Annual Exam   History of Present Illness:  Katelyn Phillips is a 64 y.o. 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  She hopes this will help with her back pain She used to get relief with OTC meds but not as helpful any longer-her back made her even she takes she is also noticed more frequent headaches over the last few months  BP Readings from Last 3 Encounters:  01/07/20 98/62  01/01/19 118/70  01/23/18 109/62     Patient Active Problem List   Diagnosis Date Noted  . Pre-diabetes 10/14/2015  . Osteoarthritis of both hands 10/14/2015    History reviewed. No pertinent past medical history.  Past Surgical History:  Procedure Laterality Date  . EXTERNAL EAR SURGERY    . TUBAL LIGATION      Social History   Tobacco Use  . Smoking status: Never Smoker  . Smokeless tobacco: Never Used  Substance Use Topics  . Alcohol use: No    Alcohol/week: 0.0 standard drinks  . Drug use: No    Family History  Problem Relation Age of Onset  . Diabetes Sister   . Colon cancer Neg Hx     Allergies  Allergen Reactions  . Latex Rash    Medication list has been reviewed and updated.  No current outpatient  medications on file prior to visit.   No current facility-administered medications on file prior to visit.    Review of Systems:  As per HPI- otherwise negative.   Physical Examination: Vitals:   01/07/20 1303  BP: 98/62  Pulse: 71  Resp: 16  SpO2: 97%   Vitals:   01/07/20 1303  Weight: 136 lb (61.7 kg)  Height: 5\' 1"  (1.549 m)   Body mass index is 25.7 kg/m. Ideal Body Weight: Weight in (lb) to have BMI = 25: 132  GEN: no acute distress.  Normal weight, looks well HEENT: Atraumatic, Normocephalic.   Bilateral TM wnl, oropharynx normal.  PEERL,EOMI.   Ears and Nose: No external deformity. CV: RRR, No M/G/R. No JVD. No thrill. No extra heart sounds. PULM: CTA B, no wheezes, crackles, rhonchi. No retractions. No resp. distress. No accessory muscle use. ABD: S, NT, ND, +BS. No rebound. No HSM. EXTR: No c/c/e PSYCH: Normally interactive. Conversant.  Patient points that muscle spasm and tenderness over the bilateral lower lumbar musculature   Assessment and Plan: Physical exam  Pre-diabetes - Plan: Comprehensive metabolic panel, Hemoglobin A1c  Screening for hyperlipidemia - Plan: Lipid panel  Screening for thyroid disorder - Plan: TSH  Screening for deficiency anemia - Plan: CBC  Fatigue, unspecified type - Plan: TSH, VITAMIN D 25 Hydroxy (Vit-D Deficiency,  Fractures)  Screening mammogram for breast cancer - Plan: MM 3D SCREEN BREAST BILATERAL  Estrogen deficiency - Plan: DG Bone Density  Chronic bilateral low back pain without sciatica - Plan: DG Lumbar Spine Complete  Needs flu shot - Plan: Flu Vaccine QUAD 6+ mos PF IM (Fluarix Quad PF)   Patient today for a physical exam.  Whenever health maintenance, ordered bone density and mammogram Recommend Covid booster Flu vaccine given Discussed her lower back pain.  This may be due to intensity physical job.  I did order lumbar films for had done at her convenience.  She is actually hoping to retire in the next  couple of months.  I asked her to keep me posted on her back pain and headaches Encouraged healthy diet and exercise routine This visit occurred during the SARS-CoV-2 public health emergency.  Safety protocols were in place, including screening questions prior to the visit, additional usage of staff PPE, and extensive cleaning of exam room while observing appropriate contact time as indicated for disinfecting solutions.    Signed Lamar Blinks, MD  Received her labs as below, message to patient  Results for orders placed or performed in visit on 01/07/20  CBC  Result Value Ref Range   WBC 5.0 4.0 - 10.5 K/uL   RBC 4.15 3.87 - 5.11 Mil/uL   Platelets 248.0 150 - 400 K/uL   Hemoglobin 12.6 12.0 - 15.0 g/dL   HCT 38.0 36 - 46 %   MCV 91.7 78.0 - 100.0 fl   MCHC 33.1 30.0 - 36.0 g/dL   RDW 13.7 11.5 - 15.5 %  Comprehensive metabolic panel  Result Value Ref Range   Sodium 139 135 - 145 mEq/L   Potassium 4.2 3.5 - 5.1 mEq/L   Chloride 102 96 - 112 mEq/L   CO2 29 19 - 32 mEq/L   Glucose, Bld 91 70 - 99 mg/dL   BUN 19 6 - 23 mg/dL   Creatinine, Ser 0.67 0.40 - 1.20 mg/dL   Total Bilirubin 0.4 0.2 - 1.2 mg/dL   Alkaline Phosphatase 89 39 - 117 U/L   AST 15 0 - 37 U/L   ALT 10 0 - 35 U/L   Total Protein 7.2 6.0 - 8.3 g/dL   Albumin 4.2 3.5 - 5.2 g/dL   GFR 92.08 >60.00 mL/min   Calcium 9.8 8.4 - 10.5 mg/dL  Hemoglobin A1c  Result Value Ref Range   Hgb A1c MFr Bld 6.1 4.6 - 6.5 %  Lipid panel  Result Value Ref Range   Cholesterol 195 0 - 200 mg/dL   Triglycerides 105.0 0 - 149 mg/dL   HDL 83.50 >39.00 mg/dL   VLDL 21.0 0.0 - 40.0 mg/dL   LDL Cholesterol 91 0 - 99 mg/dL   Total CHOL/HDL Ratio 2    NonHDL 111.58   TSH  Result Value Ref Range   TSH 1.35 0.35 - 4.50 uIU/mL  VITAMIN D 25 Hydroxy (Vit-D Deficiency, Fractures)  Result Value Ref Range   VITD 24.03 (L) 30.00 - 100.00 ng/mL

## 2020-01-07 ENCOUNTER — Other Ambulatory Visit: Payer: Self-pay

## 2020-01-07 ENCOUNTER — Encounter: Payer: Self-pay | Admitting: Family Medicine

## 2020-01-07 ENCOUNTER — Ambulatory Visit (INDEPENDENT_AMBULATORY_CARE_PROVIDER_SITE_OTHER): Payer: BC Managed Care – PPO | Admitting: Family Medicine

## 2020-01-07 VITALS — BP 98/62 | HR 71 | Resp 16 | Ht 61.0 in | Wt 136.0 lb

## 2020-01-07 DIAGNOSIS — R5383 Other fatigue: Secondary | ICD-10-CM

## 2020-01-07 DIAGNOSIS — Z23 Encounter for immunization: Secondary | ICD-10-CM

## 2020-01-07 DIAGNOSIS — Z0000 Encounter for general adult medical examination without abnormal findings: Secondary | ICD-10-CM

## 2020-01-07 DIAGNOSIS — G8929 Other chronic pain: Secondary | ICD-10-CM

## 2020-01-07 DIAGNOSIS — Z13220 Encounter for screening for lipoid disorders: Secondary | ICD-10-CM

## 2020-01-07 DIAGNOSIS — Z1329 Encounter for screening for other suspected endocrine disorder: Secondary | ICD-10-CM

## 2020-01-07 DIAGNOSIS — R7303 Prediabetes: Secondary | ICD-10-CM

## 2020-01-07 DIAGNOSIS — Z130 Encounter for screening for diseases of the blood and blood-forming organs and certain disorders involving the immune mechanism: Secondary | ICD-10-CM

## 2020-01-07 DIAGNOSIS — M545 Low back pain, unspecified: Secondary | ICD-10-CM

## 2020-01-07 DIAGNOSIS — E2839 Other primary ovarian failure: Secondary | ICD-10-CM

## 2020-01-07 DIAGNOSIS — Z1231 Encounter for screening mammogram for malignant neoplasm of breast: Secondary | ICD-10-CM

## 2020-01-07 LAB — LIPID PANEL
Cholesterol: 195 mg/dL (ref 0–200)
HDL: 83.5 mg/dL (ref >39.00)
LDL Cholesterol: 91 mg/dL (ref 0–99)
NonHDL: 111.58
Total CHOL/HDL Ratio: 2
Triglycerides: 105 mg/dL (ref 0.0–149.0)
VLDL: 21 mg/dL (ref 0.0–40.0)

## 2020-01-07 LAB — CBC
HCT: 38 % (ref 36.0–46.0)
Hemoglobin: 12.6 g/dL (ref 12.0–15.0)
MCHC: 33.1 g/dL (ref 30.0–36.0)
MCV: 91.7 fl (ref 78.0–100.0)
Platelets: 248 10*3/uL (ref 150.0–400.0)
RBC: 4.15 Mil/uL (ref 3.87–5.11)
RDW: 13.7 % (ref 11.5–15.5)
WBC: 5 10*3/uL (ref 4.0–10.5)

## 2020-01-07 LAB — COMPREHENSIVE METABOLIC PANEL
ALT: 10 U/L (ref 0–35)
AST: 15 U/L (ref 0–37)
Albumin: 4.2 g/dL (ref 3.5–5.2)
Alkaline Phosphatase: 89 U/L (ref 39–117)
BUN: 19 mg/dL (ref 6–23)
CO2: 29 mEq/L (ref 19–32)
Calcium: 9.8 mg/dL (ref 8.4–10.5)
Chloride: 102 mEq/L (ref 96–112)
Creatinine, Ser: 0.67 mg/dL (ref 0.40–1.20)
GFR: 92.08 mL/min (ref >60.00)
Glucose, Bld: 91 mg/dL (ref 70–99)
Potassium: 4.2 mEq/L (ref 3.5–5.1)
Sodium: 139 mEq/L (ref 135–145)
Total Bilirubin: 0.4 mg/dL (ref 0.2–1.2)
Total Protein: 7.2 g/dL (ref 6.0–8.3)

## 2020-01-07 LAB — TSH: TSH: 1.35 u[IU]/mL (ref 0.35–4.50)

## 2020-01-07 LAB — VITAMIN D 25 HYDROXY (VIT D DEFICIENCY, FRACTURES): VITD: 24.03 ng/mL — ABNORMAL LOW (ref 30.00–100.00)

## 2020-01-07 LAB — HEMOGLOBIN A1C: Hgb A1c MFr Bld: 6.1 % (ref 4.6–6.5)

## 2020-03-19 IMAGING — US US TRANSVAGINAL NON-OB
1 series · 14 of 25 positions shown · non-contrast
Comparison: None.

CLINICAL DATA: Postmenopausal bleeding

EXAM:
ULTRASOUND PELVIS TRANSVAGINAL
TECHNIQUE: Transvaginal ultrasound examination of the pelvis was performed
including evaluation of the uterus, ovaries, adnexal regions, and
pelvic cul-de-sac.

[Series 1: us transvaginal non-ob · 0.09mm/px · 14 of 29 slices shown]
[im 1/29]
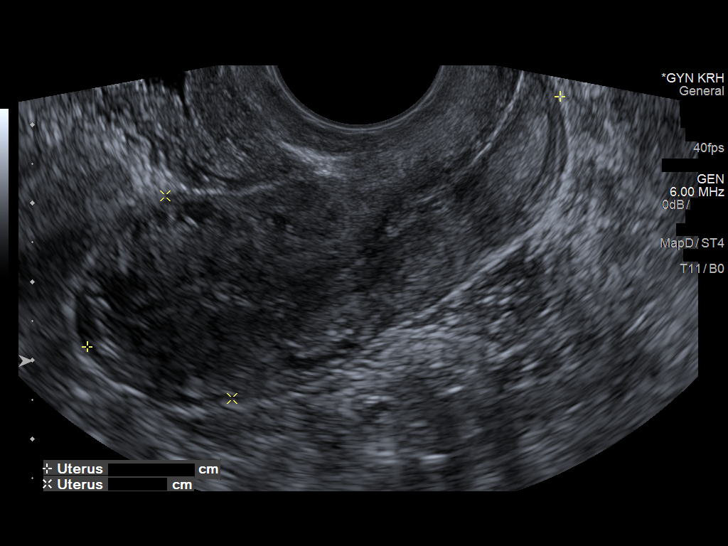
[im 3/29]
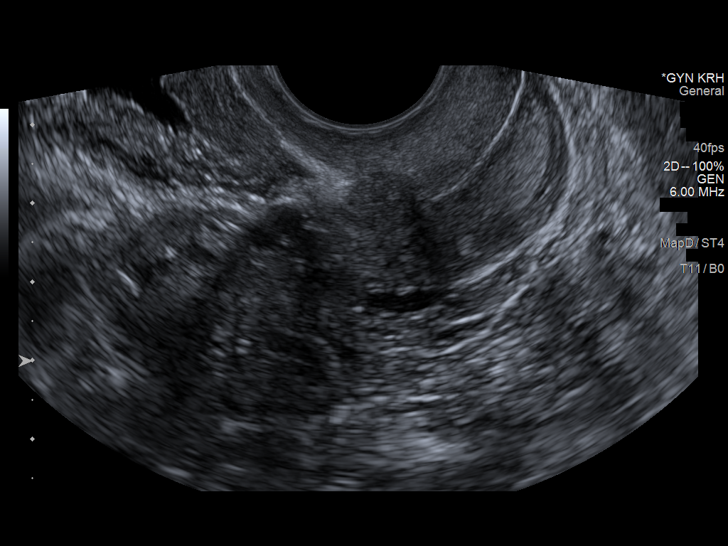
[im 5/29]
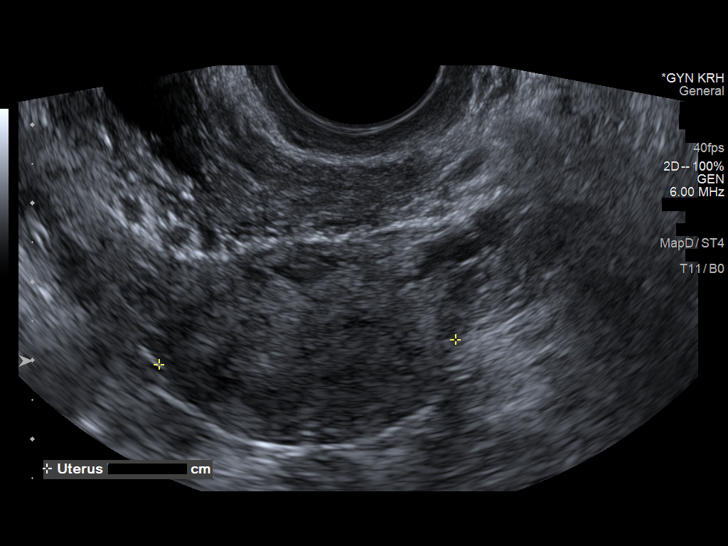
[im 8/29]
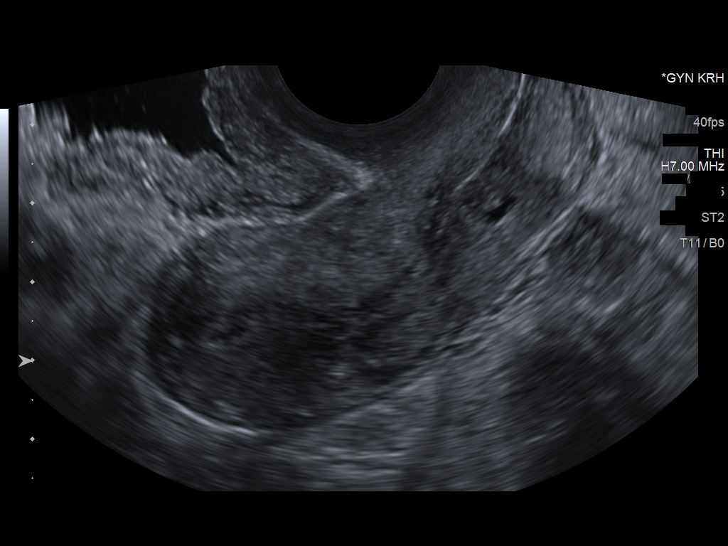
[im 10/29]
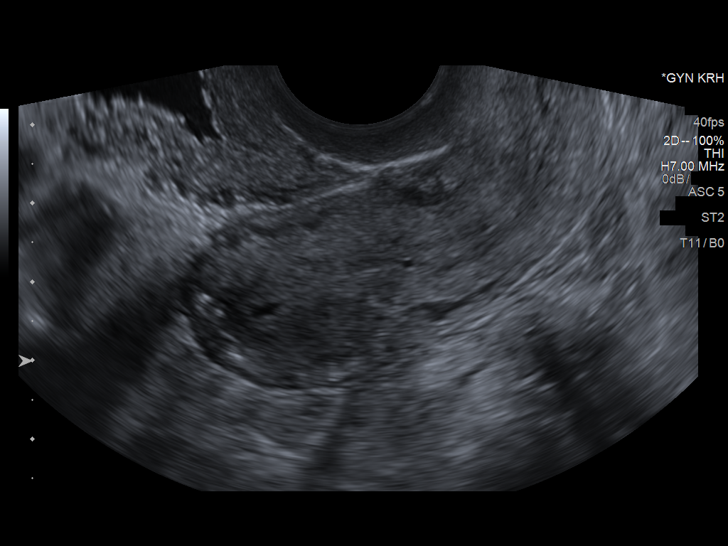
[im 11/29]
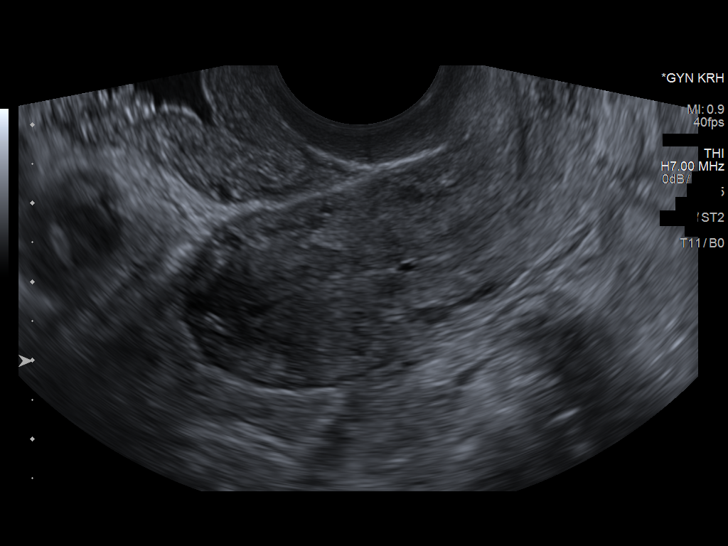
[im 13/29]
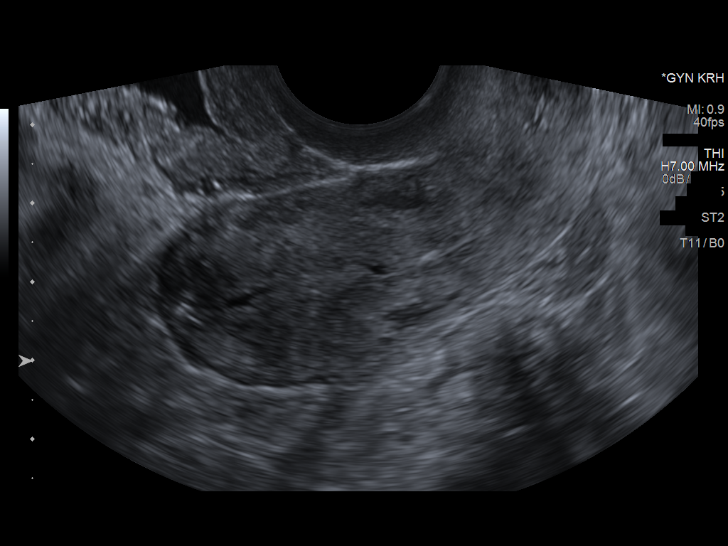
[im 16/29]
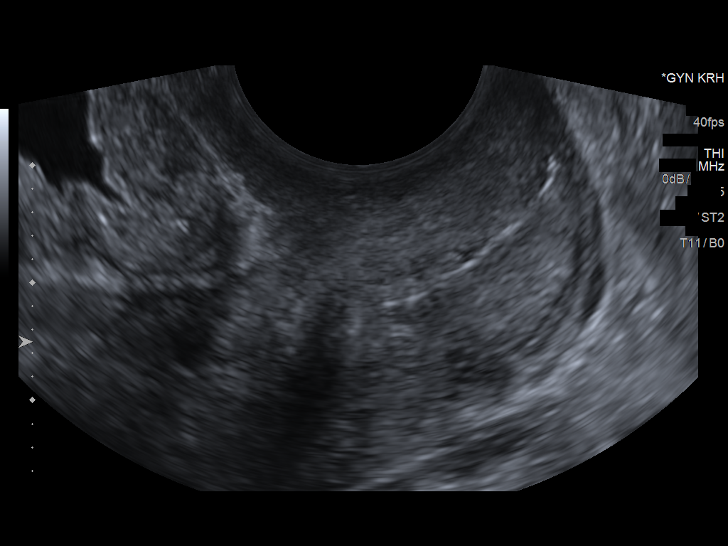
[im 18/29]
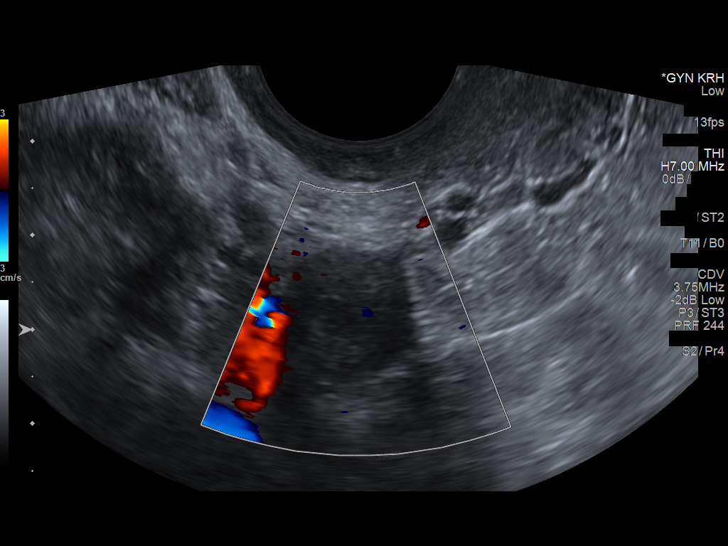
[im 19/29]
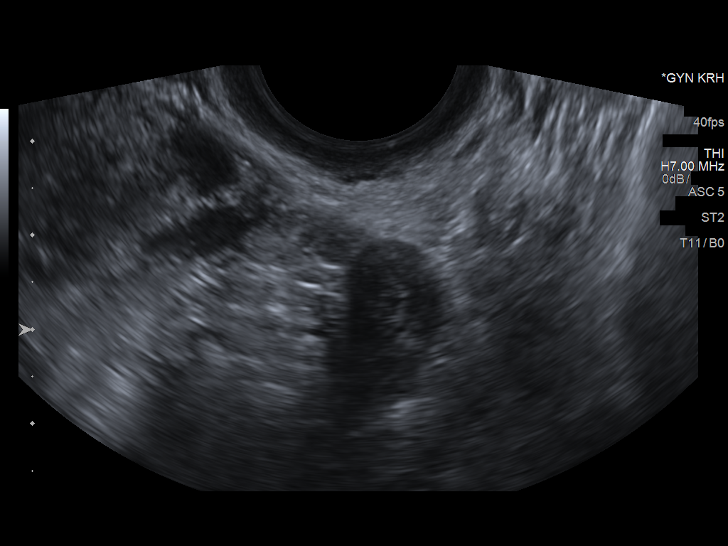
[im 22/29]
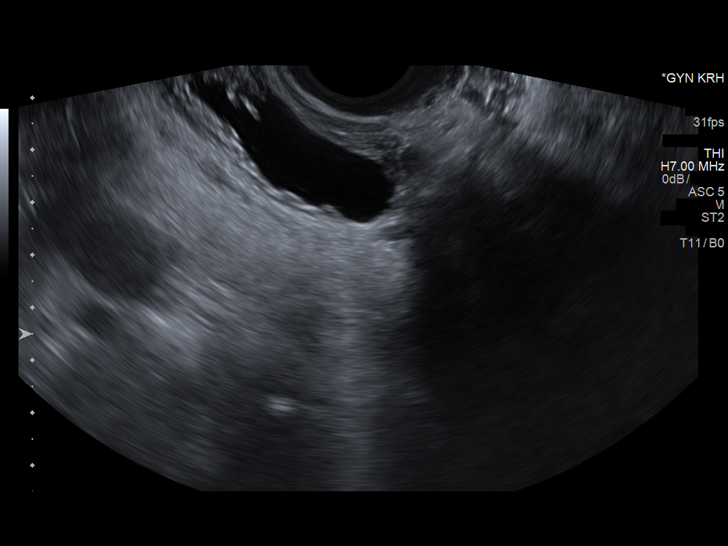
[im 24/29]
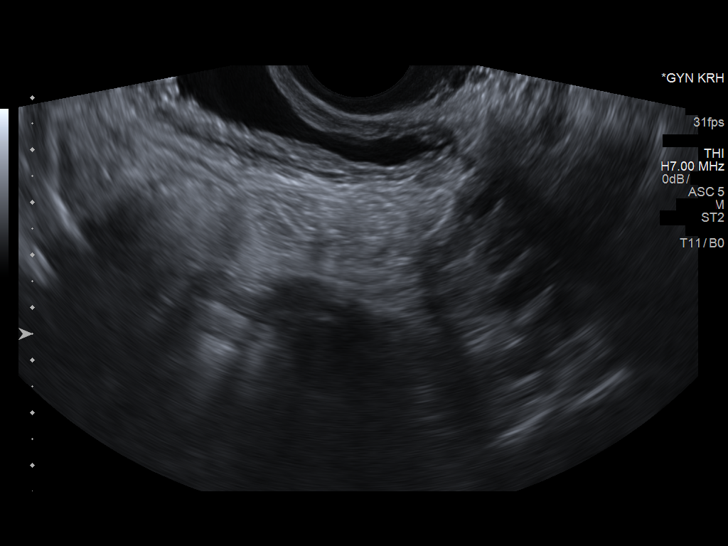
[im 26/29]
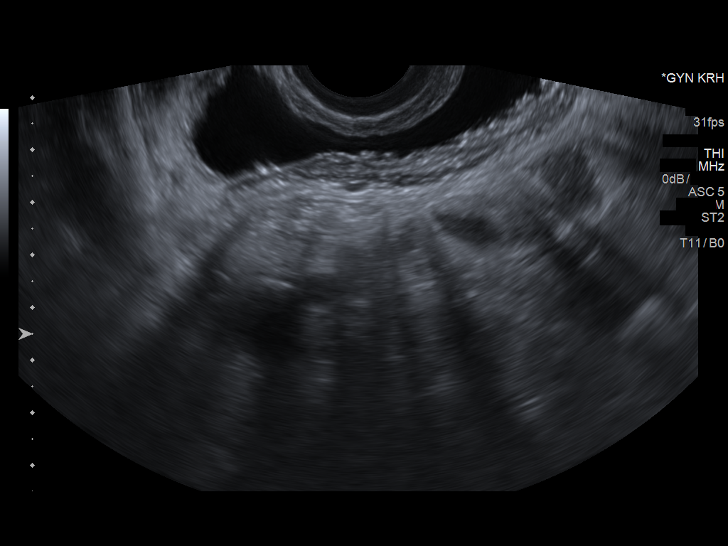
[im 29/29]
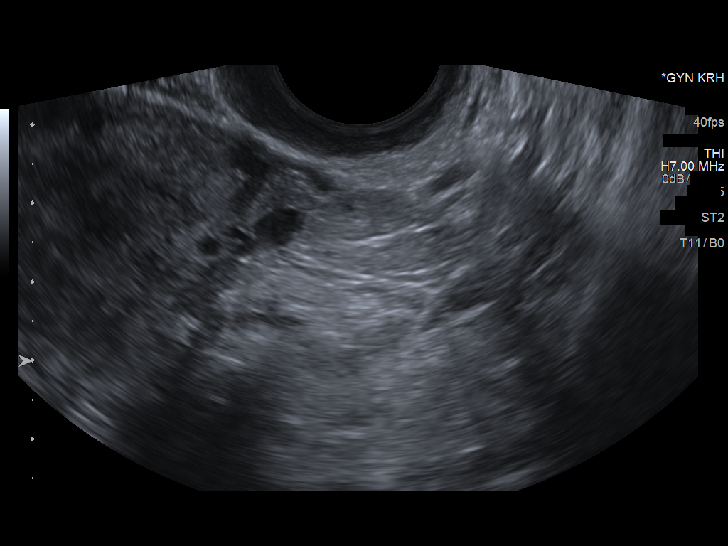

[14 of 25 positions shown; findings below may reference images not displayed]

FINDINGS: Uterus

Measurements: 6.8 x 2.7 x 3.8 cm. No fibroids or other mass
visualized.

Endometrium

Thickness: 3 mm in thickness. Small amount of fluid within the
endometrial canal within the fundus. No focal abnormality
visualized.

Right ovary

Measurements: Not visualized.  No adnexal mass seen.

Left ovary

Measurements: 1.5 x 1.3 x 1.1 cm. Normal appearance/no adnexal mass.

Other findings: Incomplete emptying of the bladder demonstrates
irregular bladder wall.
IMPRESSION: No focal endometrial abnormality or thickness. Trace fluid within
the endometrial canal in the fundus.

Irregular appearing bladder wall. Recommend clinical correlation.
This could be further evaluated with cystoscopy if felt clinically
indicated.

## 2021-01-05 NOTE — Progress Notes (Addendum)
[facility], [place], [place] [address] [phone]  Date:  01/12/2021   Name:  Katelyn Phillips   DOB:  November 16, 1955   MRN:  [id number]  PCP:  Darreld Mclean, MD    Chief Complaint: yearly OV (Concerns/ questions: back pain, myalgias/Flu shot today: yes)   History of Present Illness:  Katelyn Phillips is a 65 y.o. very pleasant female patient who presents with the following:  Pt seen today for a CPE/ medicare- history of arthritis, prediabetes  Last visit with myself about one year ago for CPE  Urology pt at [facility]- History of sacrospinous ligament fixation (hysteropexy), anterior and posterior colporrhaphy, retropubic mid-urethral sling, and cystoscopy on 09/30/2018 by Dr. Zigmund Daniel for management of Stage III uterovaginal pelvic organ prolapse and occult stress urinary incontinence.  Her sx are stable and improved   Shingrix- recommend  Covid booster- recommended  Pneumonia vaccine needed- will give today Flu shot- will give today  Colon now due- reminded, will reach out to [employer] for them as they have not heard yet about update  Labs one year ago- can be updated now , she has not eaten today   She does not get a lot of exercise but she does enjoy walking  Mild overweight, weight is stable Wt Readings from Last 3 Encounters:  01/12/21 138 lb 6.4 oz (62.8 kg)  01/07/20 136 lb (61.7 kg)  01/01/19 147 lb (66.7 kg)   She notes that both her knees are more achy now She also notes pain in her upper and lower back She has tried various OTC muscle rubs (not voltaren) as of yet Her back may bother her more when she lies down at night.  She has tried Tylenol and gotten some relief from it.  She knows Aleve has not been as helpful.  She is not aware of any particular injury  Patient Active Problem List   Diagnosis Date Noted   Pre-diabetes 10/14/2015   Osteoarthritis of both hands 10/14/2015     No past medical history on file.  Past Surgical History:  Procedure Laterality Date   EXTERNAL EAR SURGERY     TUBAL LIGATION      Social History   Tobacco Use   Smoking status: Never   Smokeless tobacco: Never  Substance Use Topics   Alcohol use: No    Alcohol/week: 0.0 standard drinks   Drug use: No    Family History  Problem Relation Age of Onset   Diabetes Sister    Colon cancer Neg Hx     Allergies  Allergen Reactions   Latex Rash    Medication list has been reviewed and updated.  No current outpatient medications on file prior to visit.   No current facility-administered medications on file prior to visit.    Review of Systems:  As per HPI- otherwise negative.   Physical Examination: Vitals:   01/12/21 0826  BP: 102/70  Pulse: (!) 58  Resp: 18  Temp: 97.6 F (36.4 C)  SpO2: 99%   Vitals:   01/12/21 0826  Weight: 138 lb 6.4 oz (62.8 kg)  Height: 5\' 1"  (1.549 m)   Body mass index is 26.15 kg/m. Ideal Body Weight: Weight in (lb) to have BMI = 25: 132  GEN: no acute distress.  Minimal overweight, looks well HEENT: Atraumatic, Normocephalic.  Ears and Nose: No external deformity. CV: RRR, No M/G/R. No JVD. No thrill. No extra  heart sounds. PULM: CTA B, no wheezes, crackles, rhonchi. No retractions. No resp. distress. No accessory muscle use. ABD: S, NT, ND, +BS. No rebound. No HSM. EXTR: No c/c/e PSYCH: Normally interactive. Conversant.  Crepitus is present in both knees but no apparent effusion Patient has muscular tenderness in the thoracic and lumbar spines.  No bony tenderness is present.  Normal strength of all extremities  Assessment and Plan: Screening for thyroid disorder - Plan: TSH  Screening for deficiency anemia - Plan: CBC  Pre-diabetes - Plan: Comprehensive metabolic panel, Hemoglobin A1c  Screening for hyperlipidemia - Plan: Lipid panel  Fatigue, unspecified type - Plan: TSH, VITAMIN D 25 Hydroxy (Vit-D  Deficiency, Fractures)  Need for influenza vaccination - Plan: Flu Vaccine QUAD High Dose(Fluad)  Immunization due - Plan: Pneumococcal conjugate vaccine 20-valent (Prevnar 20)  Vitamin D deficiency - Plan: VITAMIN D 25 Hydroxy (Vit-D Deficiency, Fractures)  Medication monitoring encounter - Plan: CBC  Screening for colon cancer  Encounter for screening mammogram for malignant neoplasm of breast - Plan: MM 3D SCREEN BREAST BILATERAL  Chronic bilateral thoracic back pain - Plan: DG Thoracic Spine 2 View  Lumbar back pain - Plan: DG Lumbar Spine Complete  Chronic pain of both knees  Overweight  Periodic wellness check today -Medicare -encouraged healthy diet and exercise routine  Will plan further follow- up pending labs. Given flu vaccine and pneumonia vaccine Recommend COVID booster and Shingrix Order mammogram I sent a message to her gastroenterologist asking him to recheck to patient and schedule colonoscopy Knee pain, suspect arthritis.  Offered to have her see orthopedics, for the time being she declines Back pain is also likely due to arthritis.  Will obtain plain films of the lumbar and thoracic spine today.  Gave handout regarding exercises that she can do at home.  She plans to visit her other daughter for about 6 months so declines physical therapy for now Also recommend over-the-counter Voltaren gel, Tylenol as needed  Signed Lamar Blinks, MD  Received x-ray reports as below, message to patient  DG Thoracic Spine 2 View  Result Date: 01/12/2021 CLINICAL DATA:  Back pain EXAM: THORACIC SPINE 2 VIEWS; LUMBAR SPINE - COMPLETE 4+ VIEW COMPARISON:  None. FINDINGS: No evidence of thoracic spine fracture. Alignment is normal. Mild multilevel degenerative disc disease consisting of osteophyte formation. Disc spaces are relatively well maintained. Soft tissues are unremarkable. There are 5 non-rib-bearing lumbar type vertebral bodies. No evidence of lumbar spine fracture.  Alignment is normal. Mild multilevel degenerative disc disease consisting of osteophyte formation. Disc spaces are relatively well maintained. Mild facet arthropathy. Soft tissues are unremarkable. IMPRESSION: No acute osseous abnormality.  Mild multilevel degenerative changes. Electronically Signed   By: Yetta Glassman M.D.   On: 01/12/2021 09:57   DG Lumbar Spine Complete  Result Date: 01/12/2021 CLINICAL DATA:  Back pain EXAM: THORACIC SPINE 2 VIEWS; LUMBAR SPINE - COMPLETE 4+ VIEW COMPARISON:  None. FINDINGS: No evidence of thoracic spine fracture. Alignment is normal. Mild multilevel degenerative disc disease consisting of osteophyte formation. Disc spaces are relatively well maintained. Soft tissues are unremarkable. There are 5 non-rib-bearing lumbar type vertebral bodies. No evidence of lumbar spine fracture. Alignment is normal. Mild multilevel degenerative disc disease consisting of osteophyte formation. Disc spaces are relatively well maintained. Mild facet arthropathy. Soft tissues are unremarkable. IMPRESSION: No acute osseous abnormality.  Mild multilevel degenerative changes. Electronically Signed   By: Yetta Glassman M.D.   On: 01/12/2021 09:57     Addendum 12/1-received  labs as below, message to patient  Results for orders placed or performed in visit on 01/12/21  CBC  Result Value Ref Range   WBC 4.0 4.0 - 10.5 K/uL   RBC 4.36 3.87 - 5.11 Mil/uL   Platelets 244.0 150.0 - 400.0 K/uL   Hemoglobin 13.3 12.0 - 15.0 g/dL   HCT 40.9 36.0 - 46.0 %   MCV 93.9 78.0 - 100.0 fl   MCHC 32.6 30.0 - 36.0 g/dL   RDW 13.4 11.5 - 15.5 %  Comprehensive metabolic panel  Result Value Ref Range   Sodium 141 135 - 145 mEq/L   Potassium 4.5 3.5 - 5.1 mEq/L   Chloride 105 96 - 112 mEq/L   CO2 30 19 - 32 mEq/L   Glucose, Bld 89 70 - 99 mg/dL   BUN 19 6 - 23 mg/dL   Creatinine, Ser 0.67 0.40 - 1.20 mg/dL   Total Bilirubin 0.6 0.2 - 1.2 mg/dL   Alkaline Phosphatase 73 39 - 117 U/L   AST 19  0 - 37 U/L   ALT 15 0 - 35 U/L   Total Protein 7.7 6.0 - 8.3 g/dL   Albumin 4.4 3.5 - 5.2 g/dL   GFR 91.42 >60.00 mL/min   Calcium 10.3 8.4 - 10.5 mg/dL  Hemoglobin A1c  Result Value Ref Range   Hgb A1c MFr Bld 6.2 4.6 - 6.5 %  Lipid panel  Result Value Ref Range   Cholesterol 222 (H) 0 - 200 mg/dL   Triglycerides 75.0 0.0 - 149.0 mg/dL   HDL 92.70 >39.00 mg/dL   VLDL 15.0 0.0 - 40.0 mg/dL   LDL Cholesterol 115 (H) 0 - 99 mg/dL   Total CHOL/HDL Ratio 2    NonHDL 129.53   TSH  Result Value Ref Range   TSH 1.60 0.35 - 5.50 uIU/mL  VITAMIN D 25 Hydroxy (Vit-D Deficiency, Fractures)  Result Value Ref Range   VITD 23.44 (L) 30.00 - 100.00 ng/mL

## 2021-01-05 NOTE — Patient Instructions (Addendum)
Good to see you today- I will be in touch with your labs asap  We ordered your mammogram and x-rays of your back- please stop by the imaging dept on the ground floor ot have your x-rays and set up your mammogram  You got your flu shot and pneumonia vaccine today I also recommend getting the latest covid booster and the shingles vaccine series at your pharmacy  For your back pain- work on some of the exercises I gave you on the hand- out You might try Voltaren gel which is a topical anti-inflammatory.  Tylenol OTC is also a good choice.  If this does not work try ibuprofen or aleve.  Let me know if you are not seeing improvement

## 2021-01-11 ENCOUNTER — Other Ambulatory Visit: Payer: Self-pay

## 2021-01-12 ENCOUNTER — Ambulatory Visit (INDEPENDENT_AMBULATORY_CARE_PROVIDER_SITE_OTHER): Payer: Medicare Other | Admitting: Family Medicine

## 2021-01-12 ENCOUNTER — Encounter: Payer: Self-pay | Admitting: Family Medicine

## 2021-01-12 ENCOUNTER — Ambulatory Visit (HOSPITAL_BASED_OUTPATIENT_CLINIC_OR_DEPARTMENT_OTHER)
Admission: RE | Admit: 2021-01-12 | Discharge: 2021-01-12 | Disposition: A | Discharge status: Home / Self Care | Payer: Medicare Other | Source: Ambulatory Visit | Attending: Family Medicine | Admitting: Family Medicine

## 2021-01-12 VITALS — BP 102/70 | HR 58 | Temp 97.6°F | Resp 18 | Ht 61.0 in | Wt 138.4 lb

## 2021-01-12 DIAGNOSIS — Z0000 Encounter for general adult medical examination without abnormal findings: Secondary | ICD-10-CM

## 2021-01-12 DIAGNOSIS — R7303 Prediabetes: Secondary | ICD-10-CM | POA: Diagnosis not present

## 2021-01-12 DIAGNOSIS — M25561 Pain in right knee: Secondary | ICD-10-CM

## 2021-01-12 DIAGNOSIS — E559 Vitamin D deficiency, unspecified: Secondary | ICD-10-CM

## 2021-01-12 DIAGNOSIS — Z130 Encounter for screening for diseases of the blood and blood-forming organs and certain disorders involving the immune mechanism: Secondary | ICD-10-CM | POA: Diagnosis not present

## 2021-01-12 DIAGNOSIS — Z13220 Encounter for screening for lipoid disorders: Secondary | ICD-10-CM | POA: Diagnosis not present

## 2021-01-12 DIAGNOSIS — M546 Pain in thoracic spine: Secondary | ICD-10-CM | POA: Diagnosis present

## 2021-01-12 DIAGNOSIS — Z23 Encounter for immunization: Secondary | ICD-10-CM | POA: Diagnosis not present

## 2021-01-12 DIAGNOSIS — Z1231 Encounter for screening mammogram for malignant neoplasm of breast: Secondary | ICD-10-CM

## 2021-01-12 DIAGNOSIS — R5383 Other fatigue: Secondary | ICD-10-CM | POA: Diagnosis not present

## 2021-01-12 DIAGNOSIS — Z1329 Encounter for screening for other suspected endocrine disorder: Secondary | ICD-10-CM

## 2021-01-12 DIAGNOSIS — M545 Low back pain, unspecified: Secondary | ICD-10-CM | POA: Diagnosis not present

## 2021-01-12 DIAGNOSIS — Z5181 Encounter for therapeutic drug level monitoring: Secondary | ICD-10-CM

## 2021-01-12 DIAGNOSIS — M25562 Pain in left knee: Secondary | ICD-10-CM

## 2021-01-12 DIAGNOSIS — E663 Overweight: Secondary | ICD-10-CM | POA: Diagnosis not present

## 2021-01-12 DIAGNOSIS — Z1211 Encounter for screening for malignant neoplasm of colon: Secondary | ICD-10-CM

## 2021-01-12 DIAGNOSIS — G8929 Other chronic pain: Secondary | ICD-10-CM | POA: Insufficient documentation

## 2021-01-12 LAB — COMPREHENSIVE METABOLIC PANEL
ALT: 15 U/L (ref 0–35)
AST: 19 U/L (ref 0–37)
Albumin: 4.4 g/dL (ref 3.5–5.2)
Alkaline Phosphatase: 73 U/L (ref 39–117)
BUN: 19 mg/dL (ref 6–23)
CO2: 30 mEq/L (ref 19–32)
Calcium: 10.3 mg/dL (ref 8.4–10.5)
Chloride: 105 mEq/L (ref 96–112)
Creatinine, Ser: 0.67 mg/dL (ref 0.40–1.20)
GFR: 91.42 mL/min (ref >60.00)
Glucose, Bld: 89 mg/dL (ref 70–99)
Potassium: 4.5 mEq/L (ref 3.5–5.1)
Sodium: 141 mEq/L (ref 135–145)
Total Bilirubin: 0.6 mg/dL (ref 0.2–1.2)
Total Protein: 7.7 g/dL (ref 6.0–8.3)

## 2021-01-12 LAB — CBC
HCT: 40.9 % (ref 36.0–46.0)
Hemoglobin: 13.3 g/dL (ref 12.0–15.0)
MCHC: 32.6 g/dL (ref 30.0–36.0)
MCV: 93.9 fl (ref 78.0–100.0)
Platelets: 244 10*3/uL (ref 150.0–400.0)
RBC: 4.36 Mil/uL (ref 3.87–5.11)
RDW: 13.4 % (ref 11.5–15.5)
WBC: 4 10*3/uL (ref 4.0–10.5)

## 2021-01-12 LAB — LIPID PANEL
Cholesterol: 222 mg/dL — ABNORMAL HIGH (ref 0–200)
HDL: 92.7 mg/dL (ref >39.00)
LDL Cholesterol: 115 mg/dL — ABNORMAL HIGH (ref 0–99)
NonHDL: 129.53
Total CHOL/HDL Ratio: 2
Triglycerides: 75 mg/dL (ref 0.0–149.0)
VLDL: 15 mg/dL (ref 0.0–40.0)

## 2021-01-12 LAB — TSH: TSH: 1.6 u[IU]/mL (ref 0.35–5.50)

## 2021-01-12 LAB — HEMOGLOBIN A1C: Hgb A1c MFr Bld: 6.2 % (ref 4.6–6.5)

## 2021-01-12 LAB — VITAMIN D 25 HYDROXY (VIT D DEFICIENCY, FRACTURES): VITD: 23.44 ng/mL — ABNORMAL LOW (ref 30.00–100.00)

## 2021-01-12 NOTE — Addendum Note (Signed)
Addended by: Lamar Blinks C on: 01/12/2021 07:44 PM   Modules accepted: Level of Service

## 2021-01-13 ENCOUNTER — Telehealth: Payer: Self-pay | Admitting: Gastroenterology

## 2021-01-13 ENCOUNTER — Encounter: Payer: Self-pay | Admitting: Family Medicine

## 2021-01-13 NOTE — Telephone Encounter (Signed)
Spanish speaking patient had colon in 2017. At that time she had adenoma polyp and a 5 yr recall was recommended. However, then the guidelines changed to 7 yr,  so now she is not due until 11-2022.

## 2021-01-17 ENCOUNTER — Encounter: Payer: Self-pay | Admitting: Gastroenterology

## 2021-01-17 NOTE — Telephone Encounter (Signed)
Called and left her another voicemail on Friday 01-14-21 and another today.  Sent her a letter in both languages notifying her of Recall Date change.

## 2021-01-20 ENCOUNTER — Ambulatory Visit (INDEPENDENT_AMBULATORY_CARE_PROVIDER_SITE_OTHER): Payer: Medicare Other

## 2021-01-20 ENCOUNTER — Other Ambulatory Visit: Payer: Self-pay

## 2021-01-20 DIAGNOSIS — Z1231 Encounter for screening mammogram for malignant neoplasm of breast: Secondary | ICD-10-CM

## 2021-07-12 DIAGNOSIS — H52223 Regular astigmatism, bilateral: Secondary | ICD-10-CM | POA: Diagnosis not present

## 2021-07-12 DIAGNOSIS — H5203 Hypermetropia, bilateral: Secondary | ICD-10-CM | POA: Diagnosis not present

## 2021-07-12 DIAGNOSIS — H2513 Age-related nuclear cataract, bilateral: Secondary | ICD-10-CM | POA: Diagnosis not present

## 2021-07-12 DIAGNOSIS — H524 Presbyopia: Secondary | ICD-10-CM | POA: Diagnosis not present

## 2021-07-13 DIAGNOSIS — H52223 Regular astigmatism, bilateral: Secondary | ICD-10-CM | POA: Diagnosis not present

## 2021-07-13 DIAGNOSIS — H524 Presbyopia: Secondary | ICD-10-CM | POA: Diagnosis not present

## 2021-07-13 DIAGNOSIS — H5203 Hypermetropia, bilateral: Secondary | ICD-10-CM | POA: Diagnosis not present

## 2021-07-13 DIAGNOSIS — H2513 Age-related nuclear cataract, bilateral: Secondary | ICD-10-CM | POA: Diagnosis not present

## 2021-11-16 DIAGNOSIS — R030 Elevated blood-pressure reading, without diagnosis of hypertension: Secondary | ICD-10-CM | POA: Diagnosis not present

## 2021-11-16 DIAGNOSIS — M1990 Unspecified osteoarthritis, unspecified site: Secondary | ICD-10-CM | POA: Diagnosis not present

## 2022-01-13 NOTE — Patient Instructions (Signed)
Good to see you again today- I will be in touch with your labs Recommend shingrix and covid booster asap   Flu shot today For cough- try allergy medication such as zyrtec or claritin OTC, and a nasal spray such a flonase or nasacort  If this is not improving the cough please let me know and I will be in touch with chest x-ray report

## 2022-01-13 NOTE — Progress Notes (Unsigned)
[facility] [address], [place], [place] [address] [phone]  Date:  01/16/2022   Name:  Katelyn Phillips   DOB:  07-Feb-1956   MRN:  [id number]  PCP:  Darreld Mclean, MD    Chief Complaint: yearly OV (Concerns/ questions: cough that wont resolve x "a few months"/Flu shot today: yes/AWV due/)   History of Present Illness:  Katelyn Phillips is a 66 y.o. very pleasant female patient who presents with the following:  Pt seen today for annual recheck visit - history of arthritis, prediabetes  Last visit with myself about one year ago   Shingrix recommended  Dexa- order today  Colon cancer screening- pt notes done at age 33, negative.  She would like to go for cologuard this time  Flu shot- give today  Covid booster recommended Mammo about one year ago - order today  Labs one year ago   She has noted a cough but no other sx for a few months  They have tried dayquil, nyquil, etc Never been a smoker   She walks for exercise for about 20 minutes a day, no shortness of breath or chest pain.  I encouraged her to increase exercise to at least 30 or 40 minutes a day  Accompanied by her daughter today who assists with interpretation   Patient Active Problem List   Diagnosis Date Noted   Pre-diabetes 10/14/2015   Osteoarthritis of both hands 10/14/2015    No past medical history on file.  Past Surgical History:  Procedure Laterality Date   EXTERNAL EAR SURGERY     TUBAL LIGATION      Social History   Tobacco Use   Smoking status: Never   Smokeless tobacco: Never  Substance Use Topics   Alcohol use: No    Alcohol/week: 0.0 standard drinks of alcohol   Drug use: No    Family History  Problem Relation Age of Onset   Diabetes Sister    Colon cancer Neg Hx     Allergies  Allergen Reactions   Latex Rash    Medication list has been reviewed and updated.  No current outpatient medications  on file prior to visit.   No current facility-administered medications on file prior to visit.    Review of Systems:  As per HPI- otherwise negative.   Physical Examination: Vitals:   01/16/22 0816  BP: 118/80  Pulse: 77  Resp: 18  Temp: 97.8 F (36.6 C)  SpO2: 99%   Vitals:   01/16/22 0816  Weight: 142 lb 6.4 oz (64.6 kg)  Height: '5\' 1"'$  (1.549 m)   Body mass index is 26.91 kg/m. Ideal Body Weight: Weight in (lb) to have BMI = 25: 132  GEN: no acute distress.  Mild overweight, looks well HEENT: Atraumatic, Normocephalic. Bilateral TM wnl, oropharynx normal.  PEERL,EOMI.   Ears and Nose: No external deformity. CV: RRR, No M/G/R. No JVD. No thrill. No extra heart sounds. PULM: CTA B, no wheezes, crackles, rhonchi. No retractions. No resp. distress. No accessory muscle use. ABD: S, NT, ND. No rebound. No HSM. EXTR: No c/c/e PSYCH: Normally interactive. Conversant.    Assessment and Plan: Medication monitoring encounter - Plan: Vitamin D (25 hydroxy), TSH, Lipid panel, Hemoglobin A1c, Comprehensive metabolic panel  Overweight - Plan: Vitamin D (25 hydroxy), TSH, Lipid panel, Hemoglobin A1c, Comprehensive metabolic panel  Pre-diabetes - Plan: Hemoglobin A1c, Comprehensive metabolic panel  Vitamin D deficiency -  Plan: Vitamin D (25 hydroxy)  Estrogen deficiency  Encounter for screening mammogram for malignant neoplasm of breast - Plan: MM 3D SCREEN BREAST BILATERAL  Screening for colon cancer - Plan: Cologuard  Immunization due - Plan: DG Bone Density  Chronic cough - Plan: DG Chest 2 View  Deficiency anemia - Plan: CBC  Need for immunization against influenza - Plan: Flu Vaccine QUAD High Dose(Fluad)  Follow-up on labs today, ordered Cologuard, bone density, mammogram Flu shot given Order chest film, recommend trial of allergy medication for her cough.  If she has feelings abnormal or if symptoms do not respond to allergy medication they will let me  know  Depending on her cholesterol results may want to consider coronary calcium Will plan further follow- up pending labs.   Signed Lamar Blinks, MD  Received chest film as below, message to patient DG Chest 2 View  Result Date: 01/16/2022 CLINICAL DATA:  Cough for a few months EXAM: CHEST - 2 VIEW COMPARISON:  Chest radiograph 05/05/2014 FINDINGS: The cardiomediastinal silhouette is normal There is no focal consolidation or pulmonary edema. There is no pleural effusion or pneumothorax There is no acute osseous abnormality. IMPRESSION: No radiographic evidence of acute cardiopulmonary process. Electronically Signed   By: Valetta Mole M.D.   On: 01/16/2022 09:06    Received her labs as below, message to patient Results for orders placed or performed in visit on 01/16/22  Vitamin D (25 hydroxy)  Result Value Ref Range   VITD 39.33 30.00 - 100.00 ng/mL  TSH  Result Value Ref Range   TSH 2.08 0.35 - 5.50 uIU/mL  Lipid panel  Result Value Ref Range   Cholesterol 206 (H) 0 - 200 mg/dL   Triglycerides 105.0 0.0 - 149.0 mg/dL   HDL 84.60 >39.00 mg/dL   VLDL 21.0 0.0 - 40.0 mg/dL   LDL Cholesterol 100 (H) 0 - 99 mg/dL   Total CHOL/HDL Ratio 2    NonHDL 121.31   Hemoglobin A1c  Result Value Ref Range   Hgb A1c MFr Bld 6.3 4.6 - 6.5 %  Comprehensive metabolic panel  Result Value Ref Range   Sodium 140 135 - 145 mEq/L   Potassium 4.0 3.5 - 5.1 mEq/L   Chloride 103 96 - 112 mEq/L   CO2 30 19 - 32 mEq/L   Glucose, Bld 69 (L) 70 - 99 mg/dL   BUN 16 6 - 23 mg/dL   Creatinine, Ser 0.57 0.40 - 1.20 mg/dL   Total Bilirubin 0.4 0.2 - 1.2 mg/dL   Alkaline Phosphatase 82 39 - 117 U/L   AST 18 0 - 37 U/L   ALT 14 0 - 35 U/L   Total Protein 7.4 6.0 - 8.3 g/dL   Albumin 4.3 3.5 - 5.2 g/dL   GFR 94.38 >60.00 mL/min   Calcium 9.8 8.4 - 10.5 mg/dL  CBC  Result Value Ref Range   WBC 4.5 4.0 - 10.5 K/uL   RBC 4.09 3.87 - 5.11 Mil/uL   Platelets 273.0 150.0 - 400.0 K/uL   Hemoglobin 12.8  12.0 - 15.0 g/dL   HCT 37.6 36.0 - 46.0 %   MCV 91.9 78.0 - 100.0 fl   MCHC 33.9 30.0 - 36.0 g/dL   RDW 13.8 11.5 - 15.5 %

## 2022-01-16 ENCOUNTER — Ambulatory Visit (HOSPITAL_BASED_OUTPATIENT_CLINIC_OR_DEPARTMENT_OTHER)
Admission: RE | Admit: 2022-01-16 | Discharge: 2022-01-16 | Disposition: A | Discharge status: Home / Self Care | Payer: Medicare HMO | Source: Ambulatory Visit | Attending: Family Medicine | Admitting: Family Medicine

## 2022-01-16 ENCOUNTER — Encounter: Payer: Self-pay | Admitting: Family Medicine

## 2022-01-16 ENCOUNTER — Ambulatory Visit (INDEPENDENT_AMBULATORY_CARE_PROVIDER_SITE_OTHER): Payer: Medicare HMO | Admitting: Family Medicine

## 2022-01-16 VITALS — BP 118/80 | HR 77 | Temp 97.8°F | Resp 18 | Ht 61.0 in | Wt 142.4 lb

## 2022-01-16 DIAGNOSIS — Z1211 Encounter for screening for malignant neoplasm of colon: Secondary | ICD-10-CM

## 2022-01-16 DIAGNOSIS — R053 Chronic cough: Secondary | ICD-10-CM

## 2022-01-16 DIAGNOSIS — Z5181 Encounter for therapeutic drug level monitoring: Secondary | ICD-10-CM

## 2022-01-16 DIAGNOSIS — R059 Cough, unspecified: Secondary | ICD-10-CM | POA: Diagnosis not present

## 2022-01-16 DIAGNOSIS — R7303 Prediabetes: Secondary | ICD-10-CM

## 2022-01-16 DIAGNOSIS — D539 Nutritional anemia, unspecified: Secondary | ICD-10-CM

## 2022-01-16 DIAGNOSIS — E559 Vitamin D deficiency, unspecified: Secondary | ICD-10-CM | POA: Diagnosis not present

## 2022-01-16 DIAGNOSIS — Z23 Encounter for immunization: Secondary | ICD-10-CM | POA: Diagnosis not present

## 2022-01-16 DIAGNOSIS — E2839 Other primary ovarian failure: Secondary | ICD-10-CM | POA: Diagnosis not present

## 2022-01-16 DIAGNOSIS — E663 Overweight: Secondary | ICD-10-CM

## 2022-01-16 DIAGNOSIS — Z1231 Encounter for screening mammogram for malignant neoplasm of breast: Secondary | ICD-10-CM

## 2022-01-16 LAB — LIPID PANEL
Cholesterol: 206 mg/dL — ABNORMAL HIGH (ref 0–200)
HDL: 84.6 mg/dL (ref >39.00)
LDL Cholesterol: 100 mg/dL — ABNORMAL HIGH (ref 0–99)
NonHDL: 121.31
Total CHOL/HDL Ratio: 2
Triglycerides: 105 mg/dL (ref 0.0–149.0)
VLDL: 21 mg/dL (ref 0.0–40.0)

## 2022-01-16 LAB — COMPREHENSIVE METABOLIC PANEL
ALT: 14 U/L (ref 0–35)
AST: 18 U/L (ref 0–37)
Albumin: 4.3 g/dL (ref 3.5–5.2)
Alkaline Phosphatase: 82 U/L (ref 39–117)
BUN: 16 mg/dL (ref 6–23)
CO2: 30 mEq/L (ref 19–32)
Calcium: 9.8 mg/dL (ref 8.4–10.5)
Chloride: 103 mEq/L (ref 96–112)
Creatinine, Ser: 0.57 mg/dL (ref 0.40–1.20)
GFR: 94.38 mL/min (ref >60.00)
Glucose, Bld: 69 mg/dL — ABNORMAL LOW (ref 70–99)
Potassium: 4 mEq/L (ref 3.5–5.1)
Sodium: 140 mEq/L (ref 135–145)
Total Bilirubin: 0.4 mg/dL (ref 0.2–1.2)
Total Protein: 7.4 g/dL (ref 6.0–8.3)

## 2022-01-16 LAB — CBC
HCT: 37.6 % (ref 36.0–46.0)
Hemoglobin: 12.8 g/dL (ref 12.0–15.0)
MCHC: 33.9 g/dL (ref 30.0–36.0)
MCV: 91.9 fl (ref 78.0–100.0)
Platelets: 273 10*3/uL (ref 150.0–400.0)
RBC: 4.09 Mil/uL (ref 3.87–5.11)
RDW: 13.8 % (ref 11.5–15.5)
WBC: 4.5 10*3/uL (ref 4.0–10.5)

## 2022-01-16 LAB — HEMOGLOBIN A1C: Hgb A1c MFr Bld: 6.3 % (ref 4.6–6.5)

## 2022-01-16 LAB — TSH: TSH: 2.08 u[IU]/mL (ref 0.35–5.50)

## 2022-01-16 LAB — VITAMIN D 25 HYDROXY (VIT D DEFICIENCY, FRACTURES): VITD: 39.33 ng/mL (ref 30.00–100.00)

## 2022-01-20 DIAGNOSIS — Z1211 Encounter for screening for malignant neoplasm of colon: Secondary | ICD-10-CM | POA: Diagnosis not present

## 2022-01-23 ENCOUNTER — Encounter: Payer: Self-pay | Admitting: Family Medicine

## 2022-01-23 ENCOUNTER — Encounter (HOSPITAL_BASED_OUTPATIENT_CLINIC_OR_DEPARTMENT_OTHER): Payer: Self-pay

## 2022-01-23 ENCOUNTER — Ambulatory Visit (HOSPITAL_BASED_OUTPATIENT_CLINIC_OR_DEPARTMENT_OTHER)
Admission: RE | Admit: 2022-01-23 | Discharge: 2022-01-23 | Disposition: A | Discharge status: Home / Self Care | Payer: Medicare HMO | Source: Ambulatory Visit | Attending: Family Medicine | Admitting: Family Medicine

## 2022-01-23 DIAGNOSIS — Z1231 Encounter for screening mammogram for malignant neoplasm of breast: Secondary | ICD-10-CM | POA: Diagnosis not present

## 2022-01-23 DIAGNOSIS — Z780 Asymptomatic menopausal state: Secondary | ICD-10-CM | POA: Diagnosis not present

## 2022-01-23 DIAGNOSIS — Z23 Encounter for immunization: Secondary | ICD-10-CM

## 2022-01-23 DIAGNOSIS — Z13820 Encounter for screening for osteoporosis: Secondary | ICD-10-CM | POA: Diagnosis not present

## 2022-01-23 DIAGNOSIS — M8580 Other specified disorders of bone density and structure, unspecified site: Secondary | ICD-10-CM | POA: Insufficient documentation

## 2022-01-23 DIAGNOSIS — M8589 Other specified disorders of bone density and structure, multiple sites: Secondary | ICD-10-CM | POA: Diagnosis not present

## 2022-01-28 ENCOUNTER — Encounter: Payer: Self-pay | Admitting: Family Medicine

## 2022-01-28 LAB — COLOGUARD: COLOGUARD: NEGATIVE

## 2022-10-27 DIAGNOSIS — M810 Age-related osteoporosis without current pathological fracture: Secondary | ICD-10-CM | POA: Diagnosis not present

## 2022-10-27 DIAGNOSIS — M1990 Unspecified osteoarthritis, unspecified site: Secondary | ICD-10-CM | POA: Diagnosis not present

## 2022-10-27 DIAGNOSIS — Z008 Encounter for other general examination: Secondary | ICD-10-CM | POA: Diagnosis not present

## 2022-10-30 ENCOUNTER — Telehealth: Payer: Self-pay | Admitting: Family Medicine

## 2022-10-30 ENCOUNTER — Encounter: Payer: Self-pay | Admitting: Family

## 2022-10-30 ENCOUNTER — Ambulatory Visit (INDEPENDENT_AMBULATORY_CARE_PROVIDER_SITE_OTHER): Payer: Medicare HMO | Admitting: Family

## 2022-10-30 VITALS — BP 107/50 | HR 86 | Temp 98.8°F | Resp 16 | Wt 144.0 lb

## 2022-10-30 DIAGNOSIS — B349 Viral infection, unspecified: Secondary | ICD-10-CM | POA: Insufficient documentation

## 2022-10-30 LAB — POC COVID19 BINAXNOW: SARS Coronavirus 2 Ag: NEGATIVE

## 2022-10-30 LAB — POCT INFLUENZA A/B
Influenza A, POC: NEGATIVE
Influenza B, POC: NEGATIVE

## 2022-10-30 MED ORDER — ONDANSETRON HCL 4 MG PO TABS: 4.0000 mg | ORAL_TABLET | Freq: Three times a day (TID) | ORAL | 0 refills | Status: AC | PRN

## 2022-10-30 NOTE — Telephone Encounter (Signed)
Called pharmacy and the prescription is ready for pick up. There was no call back number taken with message and number listed for patient goes to voice mail

## 2022-10-30 NOTE — Assessment & Plan Note (Signed)
New.   Fever, body aches, cough, sore throat, headache, diarrhea, and vomiting for 2 days. Negative for COVID and flu tests. Discussed the possibility of false negative COVID test early in the disease course. -Prescribe antiemetic for nausea. -Advise over-the-counter Delsym for cough. -Continue Tylenol for fever and body aches. -Encourage hydration with Pedialyte or Gatorade. -Retest for COVID in 2 days. -If symptoms worsen or patient is unable to keep down liquids, advise to go to the ER. -If positive for COVID, notify the office.

## 2022-10-30 NOTE — Patient Instructions (Signed)
VISIT SUMMARY:  During your visit, we discussed your recent symptoms of fever, body aches, sore throat, headache, cough, diarrhea, and vomiting. We ruled out COVID-19 and the flu with tests, but we discussed the possibility of a false negative COVID-19 test early in the disease course. We believe you may have a viral syndrome, which is a general term for an illness caused by a virus.  YOUR PLAN:  -VIRAL SYNDROME: To manage your symptoms, I've prescribed an antiemetic to help with your nausea. I also recommend over-the-counter Delsym for your cough and continuing Tylenol for your fever and body aches. It's important to stay hydrated, so try to drink Pedialyte or Gatorade. We'll retest you for COVID-19 in two days. If your symptoms worsen or you're unable to keep down liquids, please go to the emergency room. If you test positive for COVID-19, please notify our office.  INSTRUCTIONS:  Please remember to take the prescribed antiemetic for nausea, over-the-counter Delsym for cough, and continue Tylenol for fever and body aches. Stay hydrated with Pedialyte or Gatorade. We will retest you for COVID-19 in two days. If your symptoms worsen or you're unable to keep down liquids, please go to the emergency room. If you test positive for COVID-19, please notify our office.

## 2022-10-30 NOTE — Progress Notes (Signed)
Subjective:     Patient ID: Katelyn Phillips, female    DOB: [birth year], 67 y.o.   MRN: [id number]  Chief Complaint  Patient presents with   Nasal Congestion    Complains of congestion for 2 days   Fever    Complains of symptoms of fever but did not have thermometer    Generalized Body Aches    Complains of body aches    HPI  Discussed the use of AI scribe software for clinical note  History of Present Illness          The patient, with a hx of OA, osteopenia and borderline DM2, presents with a two-day history of fever, body aches, sore throat, headache, cough, diarrhea, and vomiting. She is accompanied by her son who assists with interpreting for today's visit. She reports a subjective fever, but did not have a thermometer to confirm it. She describes a significant headache that has been present for the past few days. She denies any urinary symptoms such as dysuria or hematuria. She has been experiencing a dry cough and dry throat. She denies any known exposure to COVID-19. Over-the-counter treatments including Benadryl, Tylenol, and teas have been used with some relief of fever, but body aches and other symptoms persist. She has been eating lightly due to symptoms and has had two bowel movements today.    Health Maintenance Due  Topic Date Due   Medicare Annual Wellness (AWV)  Never done   Zoster Vaccines- Shingrix (1 of 2) Never done   Colonoscopy  11/14/2020   INFLUENZA VACCINE  09/14/2022   COVID-19 Vaccine (3 - 2023-24 season) 10/15/2022    No past medical history on file.  Past Surgical History:  Procedure Laterality Date   EXTERNAL EAR SURGERY     TUBAL LIGATION      Family History  Problem Relation Age of Onset   Diabetes Sister    Colon cancer Neg Hx     Social History   Socioeconomic History   Marital status: Single    Spouse name: Not on file   Number of children: Not on file   Years of education: Not on file   Highest education level: Not on  file  Occupational History   Not on file  Tobacco Use   Smoking status: Never   Smokeless tobacco: Never  Substance and Sexual Activity   Alcohol use: No    Alcohol/week: 0.0 standard drinks of alcohol   Drug use: No   Sexual activity: Never  Other Topics Concern   Not on file  Social History Narrative   Not on file   Social Determinants of Health   Financial Resource Strain: Not on file  Food Insecurity: Not on file  Transportation Needs: Not on file  Physical Activity: Not on file  Stress: Not on file  Social Connections: Not on file  Intimate Partner Violence: Not on file    No outpatient medications prior to visit.   No facility-administered medications prior to visit.    Allergies  Allergen Reactions   Latex Rash    ROS See HPI    Objective:    Physical Exam Constitutional:      General: She is not in acute distress.    Appearance: Normal appearance. She is well-developed.  HENT:     Head: Normocephalic and atraumatic.     Right Ear: Hearing, tympanic membrane and external ear normal. Tympanic membrane is not erythematous.     Left Ear: Hearing and  external ear normal. Tympanic membrane is scarred. Tympanic membrane is not erythematous.     Mouth/Throat:     Pharynx: No posterior oropharyngeal erythema.     Tonsils: No tonsillar exudate or tonsillar abscesses.  Eyes:     General: No scleral icterus. Neck:     Thyroid: No thyromegaly.  Cardiovascular:     Rate and Rhythm: Normal rate and regular rhythm.     Heart sounds: Normal heart sounds. No murmur heard. Pulmonary:     Effort: Pulmonary effort is normal. No respiratory distress.     Breath sounds: Normal breath sounds. No wheezing.  Musculoskeletal:     Cervical back: Neck supple.  Lymphadenopathy:     Cervical: Cervical adenopathy (mild) present.  Skin:    General: Skin is warm and dry.  Neurological:     Mental Status: She is alert and oriented to person, place, and time.  Psychiatric:         Mood and Affect: Mood normal.        Behavior: Behavior normal.        Thought Content: Thought content normal.        Judgment: Judgment normal.      BP (!) 107/50 (BP Location: Right Arm, Patient Position: Sitting, Cuff Size: Small)   Pulse 86   Temp 98.8 F (37.1 C) (Oral)   Resp 16   Wt 144 lb (65.3 kg)   SpO2 98%   BMI 27.21 kg/m  Wt Readings from Last 3 Encounters:  10/30/22 144 lb (65.3 kg)  01/16/22 142 lb 6.4 oz (64.6 kg)  01/12/21 138 lb 6.4 oz (62.8 kg)       Assessment & Plan:   Problem List Items Addressed This Visit       Unprioritized   Viral illness - Primary    New.   Fever, body aches, cough, sore throat, headache, diarrhea, and vomiting for 2 days. Negative for COVID and flu tests. Discussed the possibility of false negative COVID test early in the disease course. -Prescribe antiemetic for nausea. -Advise over-the-counter Delsym for cough. -Continue Tylenol for fever and body aches. -Encourage hydration with Pedialyte or Gatorade. -Retest for COVID in 2 days. -If symptoms worsen or patient is unable to keep down liquids, advise to go to the ER. -If positive for COVID, notify the office.      Relevant Orders   POCT Influenza A/B (Completed)   POC COVID-19 (Completed)   It looks like she is due for follow up with her PCP.  I have advised her to schedule a follow up appointment with PCP.    I am having Katelyn Phillips start on ondansetron.  Meds ordered this encounter  Medications   ondansetron (ZOFRAN) 4 MG tablet    Sig: Take 1 tablet (4 mg total) by mouth every 8 (eight) hours as needed for nausea or vomiting.    Dispense:  20 tablet    Refill:  0    Order Specific Question:   Supervising Provider    Answer:   Danise Edge A [[id number]]

## 2022-10-30 NOTE — Telephone Encounter (Signed)
Pt's son called & stated that they have arrived to pharmacy to pick up pt's prescription for Zofran. However, per the pharmacy, they do not have the prescription. I informed them that [facility] sent it in but they requested for a message to be sent back. Please advise.

## 2022-11-08 ENCOUNTER — Telehealth: Payer: Self-pay | Admitting: Family Medicine

## 2022-11-08 MED ORDER — MELOXICAM 7.5 MG PO TABS: ORAL_TABLET | ORAL | 0 refills | Status: AC

## 2022-11-08 NOTE — Telephone Encounter (Signed)
Prescription Request  11/08/2022  Is this a "Controlled Substance" medicine? No  LOV: Visit date not found  What is the name of the medication or equipment?   meloxicam (MOBIC) 7.5 MG tablet [[id number]]  DISCONTINUED  Have you contacted your pharmacy to request a refill? No   Which pharmacy would you like this sent to?   [facility] [address] [place], [place] [address] P: [phone]  Patient notified that their request is being sent to the clinical staff for review and that they should receive a response within 2 business days.   Please advise at Mobile [phone] (mobile)

## 2022-11-08 NOTE — Addendum Note (Signed)
Addended by: Abbe Amsterdam C on: 11/08/2022 09:59 PM   Modules accepted: Orders

## 2022-11-08 NOTE — Telephone Encounter (Signed)
Looks like this med was discontinued in 2019.  Pt last seen Dec 23, scheduled for Dec 24.  Okay for refill?

## 2022-12-28 ENCOUNTER — Encounter: Payer: Self-pay | Admitting: Gastroenterology

## 2023-01-29 ENCOUNTER — Encounter: Payer: Medicare HMO | Admitting: Family Medicine

## 2023-02-19 IMAGING — MG MM DIGITAL SCREENING BILAT W/ TOMO AND CAD
8 series · 8 of 24 positions shown · non-contrast
Comparison: Previous exam(s).

CLINICAL DATA: Screening.

EXAM:
DIGITAL SCREENING BILATERAL MAMMOGRAM WITH TOMOSYNTHESIS AND CAD
TECHNIQUE: Bilateral screening digital craniocaudal and mediolateral oblique
mammograms were obtained. Bilateral screening digital breast
tomosynthesis was performed. The images were evaluated with
computer-aided detection.

[L MLO synth-2D]
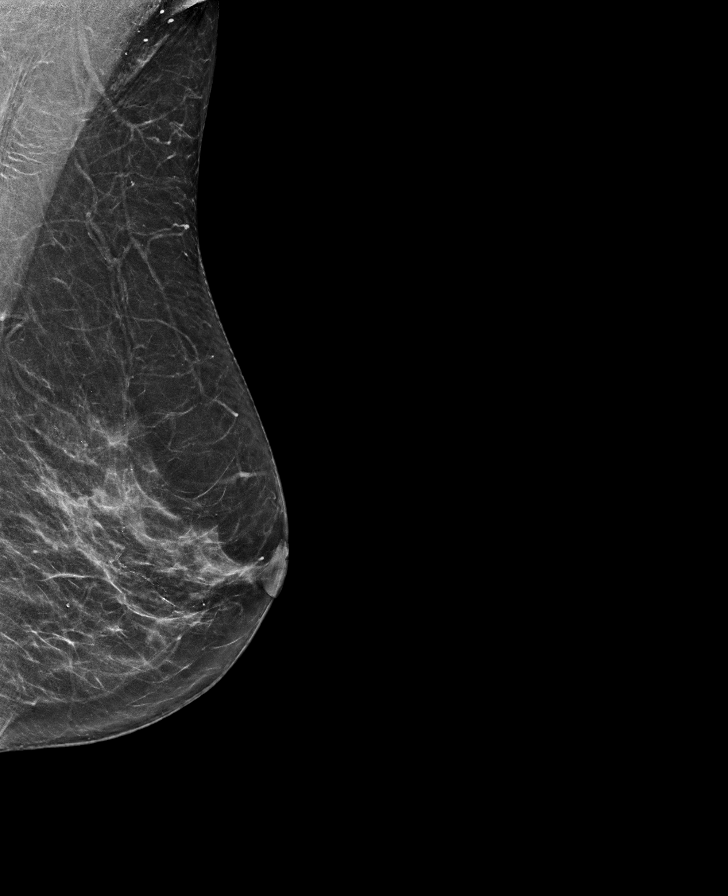

[R MLO synth-2D]
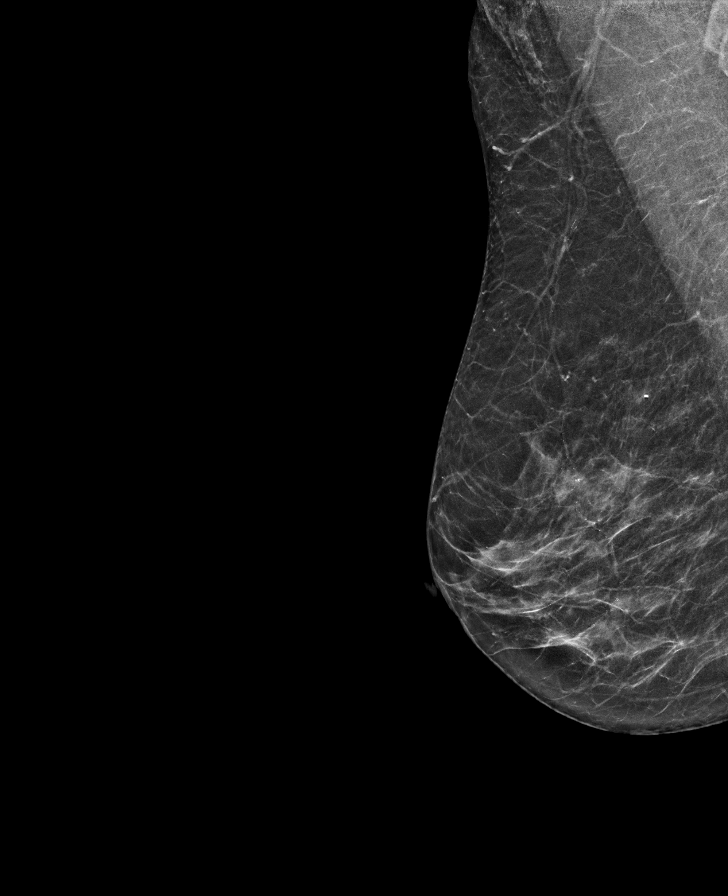

[L CC synth-2D]
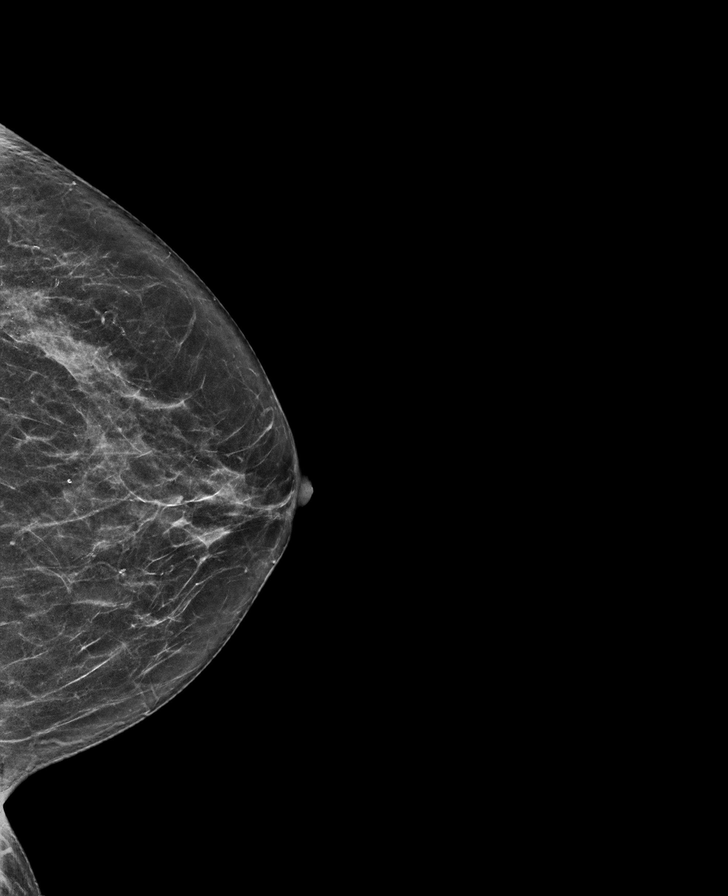

[R CC synth-2D]
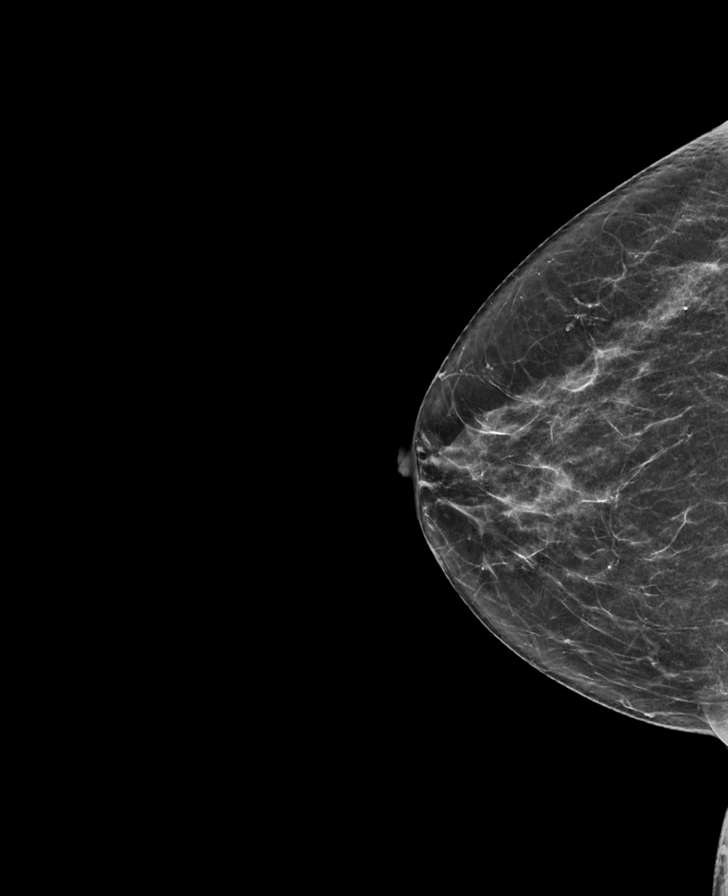

[L MLO tomo · tomo slice 35/69.0]
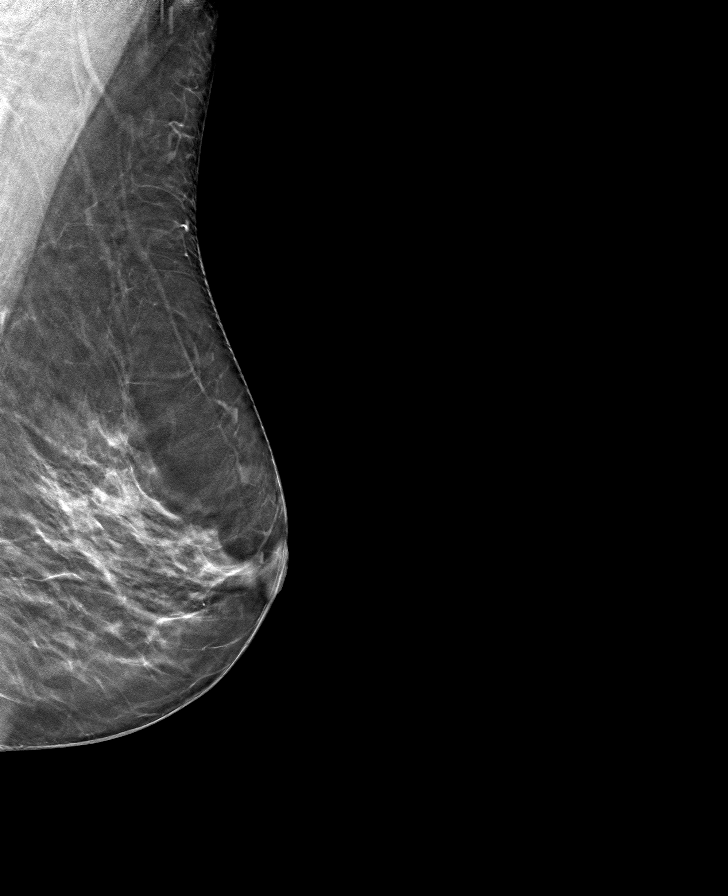

[L CC tomo · tomo slice 31/62.0]
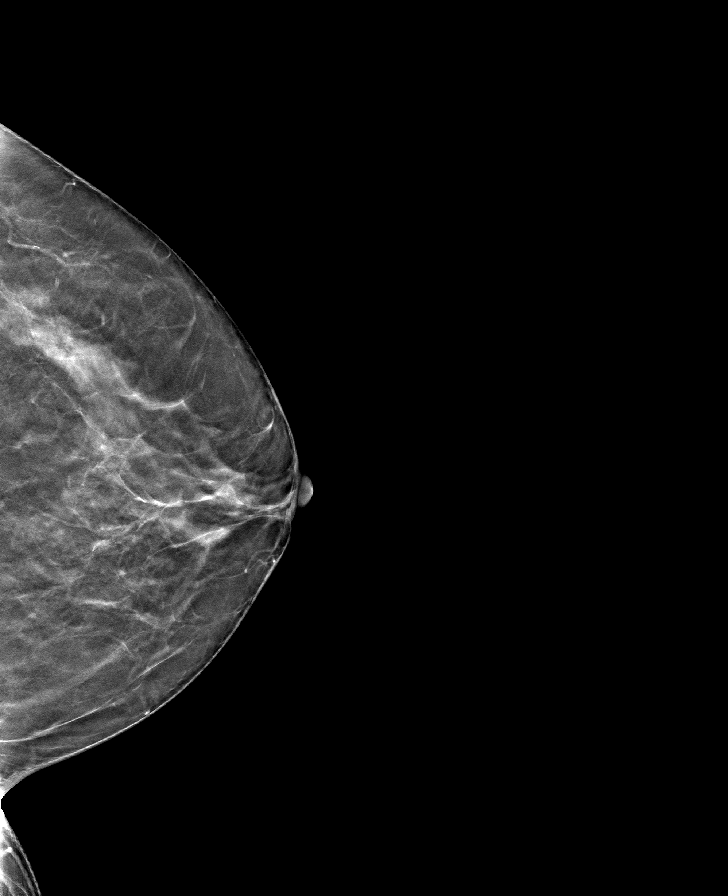

[R CC tomo · tomo slice 31/61.0]
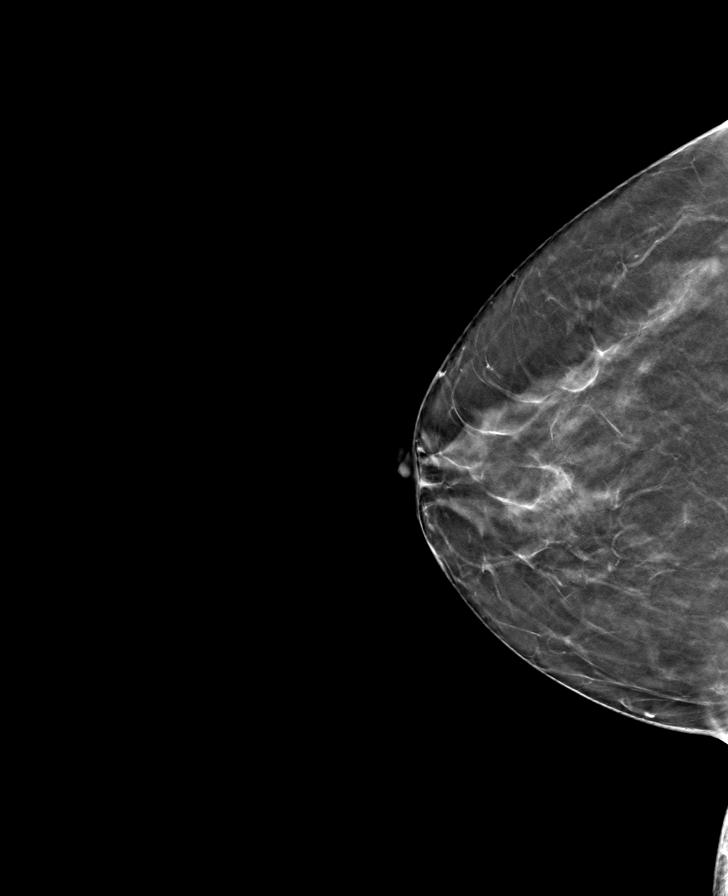

[R MLO tomo · tomo slice 31/61.0]
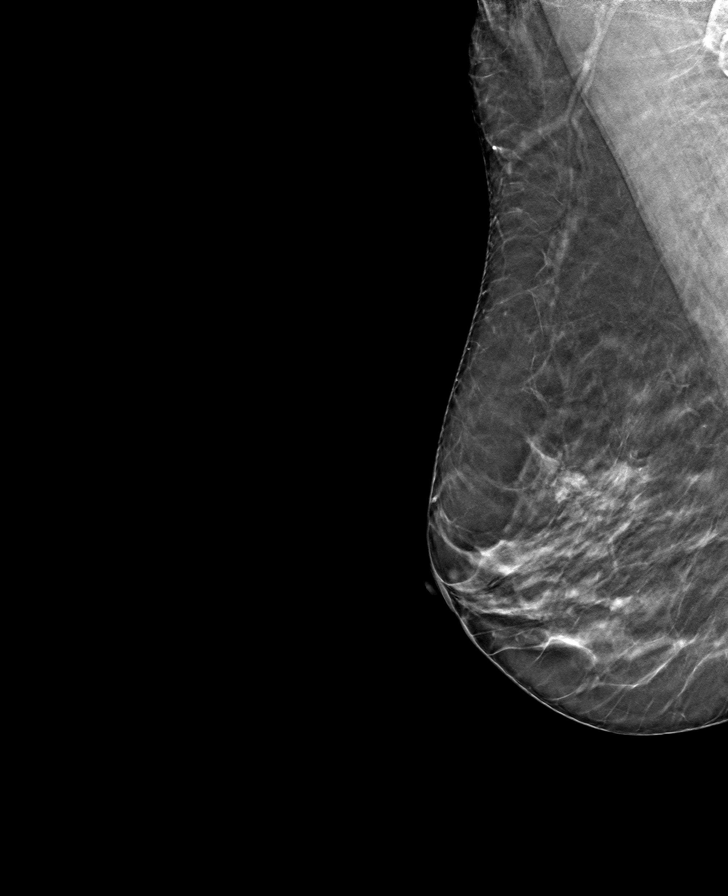

[8 of 24 positions shown; findings below may reference images not displayed]

ACR Breast Density Category c: The breast tissue is heterogeneously
dense, which may obscure small masses.
FINDINGS: There are no findings suspicious for malignancy.
IMPRESSION: No mammographic evidence of malignancy. A result letter of this
screening mammogram will be mailed directly to the patient.

RECOMMENDATION:
Screening mammogram in one year. (Code:Q3-W-BC3)

BI-RADS CATEGORY  1: Negative.
